# Patient Record
Sex: Female | Born: 1995 | Race: White | Hispanic: No | Smoking: Never smoker
Health system: Southern US, Community
[De-identification: ages and names within clinical notes are randomized; demographics above are authoritative.]

## PROBLEM LIST (undated history)

## (undated) DIAGNOSIS — Z5189 Encounter for other specified aftercare: Secondary | ICD-10-CM

## (undated) DIAGNOSIS — D649 Anemia, unspecified: Secondary | ICD-10-CM

## (undated) HISTORY — DX: Anemia, unspecified: D64.9

## (undated) HISTORY — DX: Encounter for other specified aftercare: Z51.89

---
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
## 2014-03-08 ENCOUNTER — Emergency Department: Payer: Self-pay | Admitting: Emergency Medicine

## 2014-09-17 ENCOUNTER — Emergency Department: Payer: Self-pay | Admitting: Emergency Medicine

## 2014-09-17 LAB — CBC WITH DIFFERENTIAL/PLATELET
BASOS ABS: 0.1 10*3/uL (ref 0.0–0.1)
BASOS PCT: 0.8 %
EOS ABS: 0.2 10*3/uL (ref 0.0–0.7)
EOS PCT: 2.4 %
HCT: 35.5 % (ref 35.0–47.0)
HGB: 12 g/dL (ref 12.0–16.0)
LYMPHS PCT: 40.7 %
Lymphocyte #: 3.4 10*3/uL (ref 1.0–3.6)
MCH: 27.7 pg (ref 26.0–34.0)
MCHC: 33.9 g/dL (ref 32.0–36.0)
MCV: 82 fL (ref 80–100)
MONO ABS: 0.6 x10 3/mm (ref 0.2–0.9)
Monocyte %: 7.3 %
Neutrophil #: 4.1 10*3/uL (ref 1.4–6.5)
Neutrophil %: 48.8 %
PLATELETS: 406 10*3/uL (ref 150–440)
RBC: 4.33 10*6/uL (ref 3.80–5.20)
RDW: 12.7 % (ref 11.5–14.5)
WBC: 8.3 10*3/uL (ref 3.6–11.0)

## 2014-09-17 LAB — COMPREHENSIVE METABOLIC PANEL
ALK PHOS: 85 U/L (ref 46–116)
AST: 25 U/L (ref 15–37)
Albumin: 4.1 g/dL (ref 3.4–5.0)
Anion Gap: 8 (ref 7–16)
BUN: 9 mg/dL (ref 7–18)
Bilirubin,Total: 0.2 mg/dL (ref 0.2–1.0)
CHLORIDE: 102 mmol/L (ref 98–107)
Calcium, Total: 9.7 mg/dL (ref 8.5–10.1)
Co2: 28 mmol/L (ref 21–32)
Creatinine: 0.71 mg/dL (ref 0.60–1.30)
EGFR (African American): >60
EGFR (Non-African Amer.): >60
GLUCOSE: 100 mg/dL — AB (ref 65–99)
Osmolality: 274 (ref 275–301)
Potassium: 3.7 mmol/L (ref 3.5–5.1)
SGPT (ALT): 42 U/L (ref 14–63)
SODIUM: 138 mmol/L (ref 136–145)
Total Protein: 8.3 g/dL — ABNORMAL HIGH (ref 6.4–8.2)

## 2014-09-17 LAB — URINALYSIS, COMPLETE
BACTERIA: NONE SEEN
BILIRUBIN, UR: NEGATIVE
Glucose,UR: NEGATIVE mg/dL (ref 0–75)
KETONE: NEGATIVE
LEUKOCYTE ESTERASE: NEGATIVE
NITRITE: NEGATIVE
Ph: 6 (ref 4.5–8.0)
Protein: NEGATIVE
Specific Gravity: 1.023 (ref 1.003–1.030)
Squamous Epithelial: 1
WBC UR: 1 /HPF (ref 0–5)

## 2014-09-17 LAB — PREGNANCY, URINE: Pregnancy Test, Urine: NEGATIVE m[IU]/mL

## 2015-09-29 DIAGNOSIS — E669 Obesity, unspecified: Secondary | ICD-10-CM | POA: Insufficient documentation

## 2015-09-29 LAB — HM PAP SMEAR: HM Pap smear: NEGATIVE

## 2016-02-21 ENCOUNTER — Emergency Department
Admission: EM | Admit: 2016-02-21 | Discharge: 2016-02-21 | Disposition: A | Discharge status: Home / Self Care | Payer: Self-pay | Attending: Emergency Medicine | Admitting: Emergency Medicine

## 2016-02-21 ENCOUNTER — Emergency Department: Payer: Self-pay

## 2016-02-21 ENCOUNTER — Encounter: Payer: Self-pay | Admitting: *Deleted

## 2016-02-21 DIAGNOSIS — M79662 Pain in left lower leg: Secondary | ICD-10-CM

## 2016-02-21 DIAGNOSIS — M79605 Pain in left leg: Secondary | ICD-10-CM | POA: Insufficient documentation

## 2016-02-21 LAB — CBC WITH DIFFERENTIAL/PLATELET
Basophils Absolute: 0.1 10*3/uL (ref 0–0.1)
Basophils Relative: 1 %
EOS PCT: 5 %
Eosinophils Absolute: 0.6 10*3/uL (ref 0–0.7)
HCT: 40.4 % (ref 35.0–47.0)
Hemoglobin: 13.5 g/dL (ref 12.0–16.0)
LYMPHS ABS: 3 10*3/uL (ref 1.0–3.6)
LYMPHS PCT: 30 %
MCH: 26.9 pg (ref 26.0–34.0)
MCHC: 33.4 g/dL (ref 32.0–36.0)
MCV: 80.6 fL (ref 80.0–100.0)
MONO ABS: 0.7 10*3/uL (ref 0.2–0.9)
Monocytes Relative: 7 %
Neutro Abs: 5.8 10*3/uL (ref 1.4–6.5)
Neutrophils Relative %: 57 %
PLATELETS: 378 10*3/uL (ref 150–440)
RBC: 5.01 MIL/uL (ref 3.80–5.20)
RDW: 13.7 % (ref 11.5–14.5)
WBC: 10.2 10*3/uL (ref 3.6–11.0)

## 2016-02-21 LAB — COMPREHENSIVE METABOLIC PANEL
ALT: 28 U/L (ref 14–54)
AST: 22 U/L (ref 15–41)
Albumin: 4.2 g/dL (ref 3.5–5.0)
Alkaline Phosphatase: 55 U/L (ref 38–126)
Anion gap: 8 (ref 5–15)
BUN: 10 mg/dL (ref 6–20)
CALCIUM: 9.4 mg/dL (ref 8.9–10.3)
CO2: 26 mmol/L (ref 22–32)
Chloride: 104 mmol/L (ref 101–111)
Creatinine, Ser: 0.77 mg/dL (ref 0.44–1.00)
GFR calc non Af Amer: >60 mL/min (ref >60)
Glucose, Bld: 88 mg/dL (ref 65–99)
Potassium: 3.7 mmol/L (ref 3.5–5.1)
Sodium: 138 mmol/L (ref 135–145)
Total Bilirubin: 0.8 mg/dL (ref 0.3–1.2)
Total Protein: 8.1 g/dL (ref 6.5–8.1)

## 2016-02-21 MED ORDER — NAPROXEN 500 MG PO TABS: 500.0000 mg | ORAL_TABLET | Freq: Two times a day (BID) | ORAL | Status: DC

## 2016-02-21 NOTE — ED Notes (Signed)
Pt verbalized understanding of discharge instructions. NAD at this time. 

## 2016-02-21 NOTE — ED Provider Notes (Signed)
[facility] Emergency Department Provider Note   ____________________________________________  Time seen: Approximately 3:36 PM  I have reviewed the triage vital signs and the nursing notes.   HISTORY  Chief Complaint Foot Pain   HPI Melissa Mercer is a 20 y.o. female is here with complaint of left foot and lower leg pain off and on for approximately 3 months. Patient denies any history of injury. Patient has not currently seen anyone for this. She denies any fever or chills. She continues to walk without a difficulty with these episodes. She denies any redness or heat when she touches her leg. She denies any paresthesias to her lower extremity, denies fever or chills, denies any rash. Currently she rates her pain as 6/10.   History reviewed. No pertinent past medical history.  There are no active problems to display for this patient.   History reviewed. No pertinent past surgical history.  Current Outpatient Rx  Name  Route  Sig  Dispense  Refill  . naproxen (NAPROSYN) 500 MG tablet   Oral   Take 1 tablet (500 mg total) by mouth 2 (two) times daily with a meal.   30 tablet   0     Allergies Review of patient's allergies indicates no known allergies.  No family history on file.  Social History Social History  Substance Use Topics  . Smoking status: Never Smoker   . Smokeless tobacco: None  . Alcohol Use: No    Review of Systems Constitutional: No fever/chills Cardiovascular: Denies chest pain. Respiratory: Denies shortness of breath. Gastrointestinal:   No nausea, no vomiting.  Musculoskeletal: Positive for left leg and foot pain. Skin: Negative for rash. Neurological: Negative for headaches, focal weakness or numbness.  10-point ROS otherwise negative.  ____________________________________________   PHYSICAL EXAM:  VITAL SIGNS: ED Triage Vitals  Enc Vitals Group     BP 02/21/16 1448 127/78 mmHg     Pulse Rate 02/21/16  1448 106     Resp 02/21/16 1448 18     Temp 02/21/16 1448 98.1 F (36.7 C)     Temp Source 02/21/16 1448 Oral     SpO2 02/21/16 1448 96 %     Weight 02/21/16 1448 190 lb (86.183 kg)     Height 02/21/16 1448 5\' 4"  (1.626 m)     Head Cir --      Peak Flow --      Pain Score 02/21/16 1443 6     Pain Loc --      Pain Edu? --      Excl. in GC? --     Constitutional: Alert and oriented. Well appearing and in no acute distress. Eyes: Conjunctivae are normal. PERRL. EOMI. Head: Atraumatic. Nose: No congestion/rhinnorhea. Neck: No stridor.   Cardiovascular: Normal rate, regular rhythm. Grossly normal heart sounds.  Good peripheral circulation. Respiratory: Normal respiratory effort.  No retractions. Lungs CTAB. Musculoskeletal: On examination of the left lower extremity there is tenderness on palpation of the mid tib-fib and then distally without any gross deformity noted. There is tenderness on palpation of the left foot without any gross deformity noted. There is no erythema or warmth noted. There is no skin abrasions, ecchymosis or erythema present. Motor sensory function intact. Patient is able to bear weight with a difficulty or assistance. Neurologic:  Normal speech and language. No gross focal neurologic deficits are appreciated. No gait instability. Skin:  Skin is warm, dry and intact. No rash noted. Mild nonpitting edema is  present dorsum of the left foot. Psychiatric: Mood and affect are normal. Speech and behavior are normal.  ____________________________________________   LABS (all labs ordered are listed, but only abnormal results are displayed)  Labs Reviewed  COMPREHENSIVE METABOLIC PANEL  CBC WITH DIFFERENTIAL/PLATELET  CBC WITH DIFFERENTIAL/PLATELET    RADIOLOGY  X-ray left tib-fib and foot per radiologist is negative. I, Tommi Rumpshonda L Summers, personally viewed and evaluated these images (plain radiographs) as part of my medical decision making, as well as reviewing the  written report by the radiologist. ____________________________________________   PROCEDURES  Procedure(s) performed: None  Critical Care performed: No  ____________________________________________   INITIAL IMPRESSION / ASSESSMENT AND PLAN / ED COURSE  Pertinent labs & imaging results that were available during my care of the patient were reviewed by me and considered in my medical decision making (see chart for details).  Patient was reassured that there was no findings on her x-ray to explain her pain. Lab work was within normal limits. Patient was started on naproxen 500 mg twice a day with food and to [facility] if any continued problems. ____________________________________________   FINAL CLINICAL IMPRESSION(S) / ED DIAGNOSES  Final diagnoses:  Pain in left lower leg      NEW MEDICATIONS STARTED DURING THIS VISIT:  Discharge Medication List as of 02/21/2016  5:06 PM    START taking these medications   Details  naproxen (NAPROSYN) 500 MG tablet Take 1 tablet (500 mg total) by mouth 2 (two) times daily with a meal., Starting 02/21/2016, Until Discontinued, Print         Note:  This document was prepared using Dragon voice recognition software and may include unintentional dictation errors.    Tommi RumpsRhonda L Summers, PA-C 02/21/16 1752  Sharman CheekPhillip Stafford, MD 02/22/16 [phone]

## 2016-02-21 NOTE — Discharge Instructions (Signed)
Begin taking Naprosyn twice a day with food. Avoid salt and elevate leg frequently to decrease swelling. [facility] any [facility].

## 2016-02-21 NOTE — ED Notes (Signed)
Pt reports left foot pain and swelling on and off for 3 months

## 2016-06-01 ENCOUNTER — Encounter: Payer: Self-pay | Admitting: Emergency Medicine

## 2016-06-01 ENCOUNTER — Emergency Department
Admission: EM | Admit: 2016-06-01 | Discharge: 2016-06-01 | Disposition: A | Discharge status: Home / Self Care | Payer: Medicaid Other | Attending: Emergency Medicine | Admitting: Emergency Medicine

## 2016-06-01 DIAGNOSIS — O2311 Infections of bladder in pregnancy, first trimester: Secondary | ICD-10-CM | POA: Diagnosis not present

## 2016-06-01 DIAGNOSIS — Z3A00 Weeks of gestation of pregnancy not specified: Secondary | ICD-10-CM | POA: Diagnosis not present

## 2016-06-01 DIAGNOSIS — Z331 Pregnant state, incidental: Secondary | ICD-10-CM

## 2016-06-01 DIAGNOSIS — N3001 Acute cystitis with hematuria: Secondary | ICD-10-CM

## 2016-06-01 LAB — URINALYSIS COMPLETE WITH MICROSCOPIC (ARMC ONLY)
BILIRUBIN URINE: NEGATIVE
GLUCOSE, UA: NEGATIVE mg/dL
Ketones, ur: NEGATIVE mg/dL
NITRITE: NEGATIVE
Protein, ur: 100 mg/dL — AB
SPECIFIC GRAVITY, URINE: 1.012 (ref 1.005–1.030)
pH: 7 (ref 5.0–8.0)

## 2016-06-01 LAB — POCT PREGNANCY, URINE: Preg Test, Ur: POSITIVE — AB

## 2016-06-01 MED ORDER — CEPHALEXIN 500 MG PO CAPS: 500.0000 mg | ORAL_CAPSULE | Freq: Four times a day (QID) | ORAL | 0 refills | Status: DC

## 2016-06-01 NOTE — Discharge Instructions (Signed)
Increase fluids. Take antibiotic until completely finished in 10 days. Call and make an appointment at the health department for prenatal care and also to recheck a urine.

## 2016-06-01 NOTE — ED Notes (Signed)
See triage note   States she is having dysuria and freq for about 1 week  Now states pain is getting worse and also having some left flank pain

## 2016-06-01 NOTE — ED Provider Notes (Signed)
[facility] Emergency Department Provider Note  ____________________________________________   First MD Initiated Contact with Patient 06/01/16 [phone]     (approximate)  I have reviewed the triage vital signs and the nursing notes.   HISTORY  Chief Complaint Urinary Frequency   HPI Melissa Mercer is a 20 y.o. female . Complaint of dysuria and frequency for about one week. Patient states that currently she is urinating approximately every 10-15 minutes or at least feels the urge. She is also seen some blood in her urine occasionally. She denies any fever or chills. There is no nausea or vomiting. She states she has a history of urinary tract infections. Patient also gives a history of no menses for approximately 3 months. Patient states that she is irregular and was not concerned. Patient discontinued birth control approximately 3-4 months ago.She rates her discomfort as a 10 over 10.   History reviewed. No pertinent past medical history.  There are no active problems to display for this patient.   History reviewed. No pertinent surgical history.  Prior to Admission medications   Medication Sig Start Date End Date Taking? Authorizing Provider  cephALEXin (KEFLEX) 500 MG capsule Take 1 capsule (500 mg total) by mouth 4 (four) times daily. 06/01/16   Tommi Rumps, PA-C  naproxen (NAPROSYN) 500 MG tablet Take 1 tablet (500 mg total) by mouth 2 (two) times daily with a meal. 02/21/16   Tommi Rumps, PA-C    Allergies Review of patient's allergies indicates no known allergies.  History reviewed. No pertinent family history.  Social History Social History  Substance Use Topics  . Smoking status: Never Smoker  . Smokeless tobacco: Never Used  . Alcohol use No    Review of Systems Constitutional: No fever/Negative for chills Eyes: No visual changes. ENT: No sore throat. Cardiovascular: Denies chest pain. Respiratory: Denies shortness of  breath. Gastrointestinal: No abdominal pain.  No nausea, no vomiting.  Genitourinary: Positive for dysuria. Musculoskeletal: Reported left flank pain per person. Skin: Negative for rash. Neurological: Negative for headaches, focal weakness or numbness.  10-point ROS otherwise negative.  ____________________________________________   PHYSICAL EXAM:  VITAL SIGNS: ED Triage Vitals [06/01/16 0846]  Enc Vitals Group     BP      Pulse      Resp      Temp      Temp src      SpO2      Weight 190 lb (86.2 kg)     Height 5\' 4"  (1.626 m)     Head Circumference      Peak Flow      Pain Score 10     Pain Loc      Pain Edu?      Excl. in GC?     Constitutional: Alert and oriented. Well appearing and in no acute distress. Eyes: Conjunctivae are normal. PERRL. EOMI. Head: Atraumatic. Nose: No congestion/rhinnorhea. Mouth/Throat: Mucous membranes are moist.  Oropharynx non-erythematous. Neck: No stridor.   Cardiovascular: Normal rate, regular rhythm. Grossly normal heart sounds.  Good peripheral circulation. Respiratory: Normal respiratory effort.  No retractions. Lungs CTAB. Gastrointestinal: Soft and nontender. No distention. Bowel sounds normoactive 4 quadrants. No CVA tenderness. Musculoskeletal: Moves upper and lower extremities without any difficulty. Normal gait was noted. On examination of the back there is no deformity or difficulty with range of motion. No CVA tenderness was noted and when patient was asked to point to her area of pain she points  to the upper sacral area and paravertebral muscles on the left. Normal gait was noted. Neurologic:  Normal speech and language. No gross focal neurologic deficits are appreciated. No gait instability. Skin:  Skin is warm, dry and intact. No rash noted. Psychiatric: Mood and affect are normal. Speech and behavior are normal.  ____________________________________________   LABS (all labs ordered are listed, but only abnormal results  are displayed)  Labs Reviewed  URINALYSIS COMPLETEWITH MICROSCOPIC ([facility] ONLY) - Abnormal; Notable for the following:       Result Value   Color, Urine YELLOW (*)    APPearance CLOUDY (*)    Hgb urine dipstick 3+ (*)    Protein, ur 100 (*)    Leukocytes, UA 3+ (*)    Bacteria, UA RARE (*)    Squamous Epithelial / LPF 6-30 (*)    All other components within normal limits  POCT PREGNANCY, URINE - Abnormal; Notable for the following:    Preg Test, Ur POSITIVE (*)    All other components within normal limits  URINE CULTURE  POC URINE PREG, ED     PROCEDURES  Procedure(s) performed: None  Procedures  Critical Care performed: No  ____________________________________________   INITIAL IMPRESSION / ASSESSMENT AND PLAN / ED COURSE  Pertinent labs & imaging results that were available during my care of the patient were reviewed by me and considered in my medical decision making (see chart for details).    Clinical Course   Patient was given information about her urine pregnancy test being positive. Patient denies any vaginal discharge or pelvic pain. Patient is only experiencing cystitis symptoms of burning and frequency. Patient was given a prescription for Keflex 500 mg 4 times a day for 10 days. Culture and sensitivity was ordered. Patient is follow-up with the health department for prenatal care and follow-up of her urinary tract infection.  ____________________________________________   FINAL CLINICAL IMPRESSION(S) / ED DIAGNOSES  Final diagnoses:  Acute cystitis with hematuria  Pregnancy as incidental finding      NEW MEDICATIONS STARTED DURING THIS VISIT:  Discharge Medication List as of 06/01/2016  9:46 AM    START taking these medications   Details  cephALEXin (KEFLEX) 500 MG capsule Take 1 capsule (500 mg total) by mouth 4 (four) times daily., Starting Wed 06/01/2016, Print         Note:  This document was prepared using Dragon voice recognition  software and may include unintentional dictation errors.    Tommi Rumpshonda L Summers, PA-C 06/01/16 1239    Governor Rooksebecca Lord, MD 06/01/16 1257

## 2016-06-01 NOTE — ED Triage Notes (Signed)
Pt to ed with c/o burning and frequency with urination x 4 days.  Pt states hx of UTI.

## 2016-06-03 LAB — URINE CULTURE: SPECIAL REQUESTS: NORMAL

## 2016-06-04 NOTE — Progress Notes (Addendum)
20 y/o pregnant female d/c from ED 06/01/16 on cephalexin for UTI. Urine cx grew CNS resistant to oxacillin. Dr. Dorothea GlassmanPaul Malinda authorized a prescription for Macrobid 100 mg bid x 7 days and d/c cephalexin. Attempted to call patient at [phone] but phone not accepting calls.   Melissa Mercer, PharmD Clinical Pharmacist   06/05/16 @ 1350 attempted to call patient again on only number listed in chart but number cannot accept calls.

## 2016-06-08 ENCOUNTER — Emergency Department: Payer: Medicaid Other

## 2016-06-08 ENCOUNTER — Encounter: Payer: Self-pay | Admitting: Emergency Medicine

## 2016-06-08 ENCOUNTER — Emergency Department
Admission: EM | Admit: 2016-06-08 | Discharge: 2016-06-08 | Disposition: A | Discharge status: Home / Self Care | Payer: Medicaid Other | Attending: Student in an Organized Health Care Education/Training Program | Admitting: Student in an Organized Health Care Education/Training Program

## 2016-06-08 DIAGNOSIS — O4690 Antepartum hemorrhage, unspecified, unspecified trimester: Secondary | ICD-10-CM

## 2016-06-08 DIAGNOSIS — O418X10 Other specified disorders of amniotic fluid and membranes, first trimester, not applicable or unspecified: Secondary | ICD-10-CM

## 2016-06-08 DIAGNOSIS — O468X1 Other antepartum hemorrhage, first trimester: Secondary | ICD-10-CM

## 2016-06-08 DIAGNOSIS — Z792 Long term (current) use of antibiotics: Secondary | ICD-10-CM | POA: Insufficient documentation

## 2016-06-08 DIAGNOSIS — Z791 Long term (current) use of non-steroidal anti-inflammatories (NSAID): Secondary | ICD-10-CM | POA: Diagnosis not present

## 2016-06-08 DIAGNOSIS — Z3A01 Less than 8 weeks gestation of pregnancy: Secondary | ICD-10-CM | POA: Diagnosis not present

## 2016-06-08 DIAGNOSIS — O208 Other hemorrhage in early pregnancy: Secondary | ICD-10-CM | POA: Diagnosis not present

## 2016-06-08 DIAGNOSIS — O209 Hemorrhage in early pregnancy, unspecified: Secondary | ICD-10-CM | POA: Diagnosis present

## 2016-06-08 LAB — HCG, QUANTITATIVE, PREGNANCY: HCG, BETA CHAIN, QUANT, S: 31743 m[IU]/mL — AB (ref <5)

## 2016-06-08 LAB — POCT PREGNANCY, URINE: Preg Test, Ur: POSITIVE — AB

## 2016-06-08 LAB — ABO/RH: ABO/RH(D): B POS

## 2016-06-08 NOTE — ED Provider Notes (Signed)
[name] Emergency Department Provider Note    First MD Initiated Contact with Patient 06/08/16 [phone]     (approximate)  I have reviewed the triage vital signs and the nursing notes.   HISTORY  Chief Complaint Vaginal Bleeding    HPI Melissa Mercer is a 20 y.o. female who presents with 1 week of intermittent vaginal spotting 3 since her last menstrual period. Patient is a G2 P1. States that she was recently diagnosed with a UTI found out at that time that she is pregnant. Denies any abdominal pain. States she's not had OB follow-up yet. Denies any history of easy bleeding or bruising. No nausea or vomiting. No dysuria. No fevers or chest pain.  States that she did have one small brown clot today followed by vaginal spotting. No recent trauma.   History reviewed. No pertinent past medical history.  There are no active problems to display for this patient.   History reviewed. No pertinent surgical history.  Prior to Admission medications   Medication Sig Start Date End Date Taking? Authorizing Provider  cephALEXin (KEFLEX) 500 MG capsule Take 1 capsule (500 mg total) by mouth 4 (four) times daily. 06/01/16   Tommi Rumps, PA-C  naproxen (NAPROSYN) 500 MG tablet Take 1 tablet (500 mg total) by mouth 2 (two) times daily with a meal. 02/21/16   Tommi Rumps, PA-C    Allergies Review of patient's allergies indicates no known allergies.  No family history on file.  Social History Social History  Substance Use Topics  . Smoking status: Never Smoker  . Smokeless tobacco: Never Used  . Alcohol use No    Review of Systems Patient denies headaches, rhinorrhea, blurry vision, numbness, shortness of breath, chest pain, edema, cough, abdominal pain, nausea, vomiting, diarrhea, dysuria, fevers, rashes or hallucinations unless otherwise stated above in HPI. ____________________________________________   PHYSICAL EXAM:  VITAL SIGNS: Vitals:   06/08/16 0739  BP: 115/69  Pulse: 89  Resp: 16  Temp: 98.3 F (36.8 C)    Constitutional: Alert and oriented. Well appearing and in no acute distress. Eyes: Conjunctivae are normal. PERRL. EOMI. Head: Atraumatic. Nose: No congestion/rhinnorhea. Mouth/Throat: Mucous membranes are moist.  Oropharynx non-erythematous. Neck: No stridor. Painless ROM. No cervical spine tenderness to palpation Hematological/Lymphatic/Immunilogical: No cervical lymphadenopathy. Cardiovascular: Normal rate, regular rhythm. Grossly normal heart sounds.  Good peripheral circulation. Respiratory: Normal respiratory effort.  No retractions. Lungs CTAB. Gastrointestinal: Soft and nontender. No distention. No abdominal bruits. No CVA tenderness. Genitourinary: closed cervical os, no active hemorrhage Musculoskeletal: No lower extremity tenderness nor edema.  No joint effusions. Neurologic:  Normal speech and language. No gross focal neurologic deficits are appreciated. No gait instability. Skin:  Skin is warm, dry and intact. No rash noted. Psychiatric: Mood and affect are normal. Speech and behavior are normal.  ____________________________________________   LABS (all labs ordered are listed, but only abnormal results are displayed)  Results for orders placed or performed during the hospital encounter of 06/08/16 (from the past 24 hour(s))  ABO/Rh     Status: None   Collection Time: 06/08/16  8:04 AM  Result Value Ref Range   ABO/RH(D) B POS   hCG, quantitative, pregnancy     Status: Abnormal   Collection Time: 06/08/16  8:05 AM  Result Value Ref Range   hCG, Beta Chain, Quant, S 31,743 (H) <5 mIU/mL  Pregnancy, urine POC     Status: Abnormal   Collection Time: 06/08/16  8:23 AM  Result Value Ref Range   Preg Test, Ur POSITIVE (A) NEGATIVE   ____________________________________________ ____________________________________________  RADIOLOGY  I personally reviewed all radiographic images ordered to  evaluate for the above acute complaints and reviewed radiology reports and findings.  These findings were personally discussed with the patient.  Please see medical record for radiology report.  ____________________________________________   PROCEDURES  Procedure(s) performed: none    Critical Care performed: no ____________________________________________   INITIAL IMPRESSION / ASSESSMENT AND PLAN / ED COURSE  Pertinent labs & imaging results that were available during my care of the patient were reviewed by me and considered in my medical decision making (see chart for details).  DDX: ectopic, missed ab, threatened ab, placenta previa  Melissa Mercer is a 20 y.o. who presents to the ED with vaginal spotting in early pregnancy. Patient is afebrile and hemodynamically stable. Her abdominal exam is reassuring. Bedside ultrasound unable to identify IUP but likely secondary to early gestation. Will check Rh status as well as formal ultrasound to evaluate for intrauterine pregnancy.  The patient will be placed on continuous pulse oximetry and telemetry for monitoring.  Laboratory evaluation will be sent to evaluate for the above complaints.     Clinical Course  Comment By Time  Rh Pos.  Willy EddyPatrick Ndrew Creason, MD 10/18 [phone]  Discussed results of ultrasound with patient. Discussed elective management. Willy EddyPatrick Onie Hayashi, MD 10/18 1004    Patient tolerated pelvic without discomfort.  Os is normal appearing.  Patient stable for outpatient follow up. Have discussed with the patient and available family all diagnostics and treatments performed thus far and all questions were answered to the best of my ability. The patient demonstrates understanding and agreement with plan.   ____________________________________________   FINAL CLINICAL IMPRESSION(S) / ED DIAGNOSES  Final diagnoses:  Vaginal bleeding in pregnancy  Subchorionic hematoma in first trimester, single or unspecified fetus       NEW MEDICATIONS STARTED DURING THIS VISIT:  New Prescriptions   No medications on file     Note:  This document was prepared using Dragon voice recognition software and may include unintentional dictation errors.    Willy EddyPatrick Jeanpaul Biehl, MD 06/08/16 [phone]

## 2016-06-08 NOTE — ED Triage Notes (Signed)
Reports no period for 3 months, thinks she is pregnant.  Today started having bleeding. No cramping.

## 2016-06-08 NOTE — ED Notes (Signed)
Pt ambulatory to lobby. NAD noted. 

## 2016-06-08 NOTE — ED Notes (Signed)
Patient transported to Ultrasound 

## 2016-06-20 LAB — HM HIV SCREENING LAB: HM HIV Screening: NEGATIVE

## 2018-02-09 ENCOUNTER — Emergency Department
Admission: EM | Admit: 2018-02-09 | Discharge: 2018-02-09 | Disposition: A | Discharge status: Home / Self Care | Payer: Self-pay | Attending: Emergency Medicine | Admitting: Emergency Medicine

## 2018-02-09 ENCOUNTER — Encounter: Payer: Self-pay | Admitting: Emergency Medicine

## 2018-02-09 ENCOUNTER — Other Ambulatory Visit: Payer: Self-pay

## 2018-02-09 DIAGNOSIS — N939 Abnormal uterine and vaginal bleeding, unspecified: Secondary | ICD-10-CM | POA: Insufficient documentation

## 2018-02-09 LAB — BASIC METABOLIC PANEL
Anion gap: 9 (ref 5–15)
BUN: 10 mg/dL (ref 6–20)
CHLORIDE: 103 mmol/L (ref 101–111)
CO2: 25 mmol/L (ref 22–32)
Calcium: 9.2 mg/dL (ref 8.9–10.3)
Creatinine, Ser: 0.58 mg/dL (ref 0.44–1.00)
GFR calc Af Amer: >60 mL/min (ref >60)
GFR calc non Af Amer: >60 mL/min (ref >60)
GLUCOSE: 96 mg/dL (ref 65–99)
POTASSIUM: 3.9 mmol/L (ref 3.5–5.1)
SODIUM: 137 mmol/L (ref 135–145)

## 2018-02-09 LAB — URINALYSIS, COMPLETE (UACMP) WITH MICROSCOPIC
BILIRUBIN URINE: NEGATIVE
Glucose, UA: NEGATIVE mg/dL
Ketones, ur: NEGATIVE mg/dL
LEUKOCYTES UA: NEGATIVE
Nitrite: NEGATIVE
PROTEIN: 30 mg/dL — AB
Specific Gravity, Urine: 1.014 (ref 1.005–1.030)
pH: 6 (ref 5.0–8.0)

## 2018-02-09 LAB — CBC
HEMATOCRIT: 35.2 % (ref 35.0–47.0)
Hemoglobin: 12.1 g/dL (ref 12.0–16.0)
MCH: 28.8 pg (ref 26.0–34.0)
MCHC: 34.4 g/dL (ref 32.0–36.0)
MCV: 83.9 fL (ref 80.0–100.0)
Platelets: 441 10*3/uL — ABNORMAL HIGH (ref 150–440)
RBC: 4.19 MIL/uL (ref 3.80–5.20)
RDW: 12.3 % (ref 11.5–14.5)
WBC: 11.3 10*3/uL — AB (ref 3.6–11.0)

## 2018-02-09 LAB — POCT PREGNANCY, URINE: PREG TEST UR: NEGATIVE

## 2018-02-09 NOTE — ED Triage Notes (Signed)
Patient presents to the ED with vaginal bleeding x 4 months.  Patient states she has been feeling weak.  Patient reports having to get a blood transfusion in the past due to this problem.

## 2018-02-09 NOTE — ED Notes (Signed)
Pt alert and oriented X4, active, cooperative, pt in NAD. RR even and unlabored, color WNL.  Pt informed to return if any life threatening symptoms occur.  Discharge and followup instructions reviewed.  

## 2018-02-09 NOTE — ED Provider Notes (Signed)
[facility] Emergency Department Provider Note   ____________________________________________    I have reviewed the triage vital signs and the nursing notes.   HISTORY  Chief Complaint Vaginal Bleeding     HPI Melissa Mercer is a 22 y.o. female who presents with vaginal bleeding.  Patient reports she has been bleeding for over 1 month, occasionally she has large clots.  She was started on Ortho Tri-Cyclen by her doctor 1 month ago but it has not seemed to help her bleeding.  She denies abdominal pain.  She reports when she was a teenager she had a blood transfusion for vaginal bleeding.  No pelvic pain.  Felt dizzy last night but feels well now.  History reviewed. No pertinent past medical history.  There are no active problems to display for this patient.   History reviewed. No pertinent surgical history.  Prior to Admission medications   Medication Sig Start Date End Date Taking? Authorizing Provider  cephALEXin (KEFLEX) 500 MG capsule Take 1 capsule (500 mg total) by mouth 4 (four) times daily. 06/01/16   Tommi Rumps, PA-C  naproxen (NAPROSYN) 500 MG tablet Take 1 tablet (500 mg total) by mouth 2 (two) times daily with a meal. 02/21/16   Tommi Rumps, PA-C     Allergies Patient has no known allergies.  No family history on file.  Social History Social History   Tobacco Use  . Smoking status: Never Smoker  . Smokeless tobacco: Never Used  Substance Use Topics  . Alcohol use: No  . Drug use: Not on file    Review of Systems  Constitutional: As above Eyes: No visual changes.  ENT: No neck pain Cardiovascular: No palpitation Respiratory: Denies shortness of breath. Gastrointestinal: No abdominal pain.  No nausea, no vomiting.   Genitourinary: As above Musculoskeletal: Negative for back pain. Skin: Negative for rash. Neurological: Negative for headaches   ____________________________________________   PHYSICAL  EXAM:  VITAL SIGNS: ED Triage Vitals [02/09/18 1029]  Enc Vitals Group     BP 138/90     Pulse Rate 80     Resp 16     Temp 98.9 F (37.2 C)     Temp Source Oral     SpO2 100 %     Weight 93 kg (205 lb)     Height 1.651 m (5\' 5" )     Head Circumference      Peak Flow      Pain Score 0     Pain Loc      Pain Edu?      Excl. in GC?     Constitutional: Alert and oriented. No acute distress. Pleasant and interactive Eyes: Conjunctivae are normal.  No pallor   Mouth/Throat: Mucous membranes are moist.    Cardiovascular: Normal rate, regular rhythm. Grossly normal heart sounds.  Good peripheral circulation. Respiratory: Normal respiratory effort.  No retractions. . Gastrointestinal: Soft and nontender. No distention.    Musculoskeletal:   Warm and well perfused Neurologic:  Normal speech and language. No gross focal neurologic deficits are appreciated.  Skin:  Skin is warm, dry and intact. No rash noted. Psychiatric: Mood and affect are normal. Speech and behavior are normal.  ____________________________________________   LABS (all labs ordered are listed, but only abnormal results are displayed)  Labs Reviewed  CBC - Abnormal; Notable for the following components:      Result Value   WBC 11.3 (*)    Platelets 441 (*)  All other components within normal limits  URINALYSIS, COMPLETE (UACMP) WITH MICROSCOPIC - Abnormal; Notable for the following components:   Color, Urine YELLOW (*)    APPearance CLEAR (*)    Hgb urine dipstick LARGE (*)    Protein, ur 30 (*)    Bacteria, UA RARE (*)    All other components within normal limits  BASIC METABOLIC PANEL  POC URINE PREG, ED  POCT PREGNANCY, URINE   ____________________________________________  EKG  ED ECG REPORT I, Jene Everyobert Quaran Kedzierski, the attending physician, personally viewed and interpreted this ECG.  Date: 02/09/2018  Rhythm: normal sinus rhythm QRS Axis: normal Intervals: normal ST/T Wave abnormalities:  normal Narrative Interpretation: no evidence of acute ischemia  ____________________________________________  RADIOLOGY  None ____________________________________________   PROCEDURES  Procedure(s) performed: No  Procedures   Critical Care performed: No ____________________________________________   INITIAL IMPRESSION / ASSESSMENT AND PLAN / ED COURSE  Pertinent labs & imaging results that were available during my care of the patient were reviewed by me and considered in my medical decision making (see chart for details).  Patient well-appearing in no acute distress.  Exam is reassuring.  Vitals are unremarkable.  No tachycardia.  No abdominal tenderness.  Vaginal bleeding for over 4 weeks now.  Negative pregnancy test.  We will have the patient follow-up with gynecology given stable hemoglobin   ____________________________________________   FINAL CLINICAL IMPRESSION(S) / ED DIAGNOSES  Final diagnoses:  Vaginal bleeding        Note:  This document was prepared using Dragon voice recognition software and may include unintentional dictation errors.    Jene EveryKinner, Uriyah Massimo, MD 02/09/18 [phone]

## 2019-03-05 ENCOUNTER — Encounter: Payer: Self-pay | Admitting: Emergency Medicine

## 2019-03-05 ENCOUNTER — Emergency Department
Admission: EM | Admit: 2019-03-05 | Discharge: 2019-03-05 | Disposition: A | Discharge status: Home / Self Care | Payer: Self-pay | Attending: Emergency Medicine | Admitting: Emergency Medicine

## 2019-03-05 ENCOUNTER — Other Ambulatory Visit: Payer: Self-pay

## 2019-03-05 DIAGNOSIS — N3001 Acute cystitis with hematuria: Secondary | ICD-10-CM | POA: Insufficient documentation

## 2019-03-05 LAB — URINALYSIS, COMPLETE (UACMP) WITH MICROSCOPIC
Glucose, UA: NEGATIVE mg/dL
Ketones, ur: NEGATIVE mg/dL
Nitrite: POSITIVE — AB
Protein, ur: 100 mg/dL — AB
RBC / HPF: >50 RBC/hpf — ABNORMAL HIGH (ref 0–5)
Specific Gravity, Urine: 1.034 — ABNORMAL HIGH (ref 1.005–1.030)
WBC, UA: >50 WBC/hpf — ABNORMAL HIGH (ref 0–5)
pH: 5 (ref 5.0–8.0)

## 2019-03-05 LAB — POCT PREGNANCY, URINE: Preg Test, Ur: NEGATIVE

## 2019-03-05 MED ORDER — PHENAZOPYRIDINE HCL 100 MG PO TABS
95.0000 mg | ORAL_TABLET | Freq: Once | ORAL | Status: DC
  Filled 2019-03-05: qty 1

## 2019-03-05 MED ORDER — CEPHALEXIN 500 MG PO CAPS: 500.0000 mg | ORAL_CAPSULE | Freq: Two times a day (BID) | ORAL | 0 refills | Status: DC

## 2019-03-05 MED ORDER — PHENAZOPYRIDINE HCL 100 MG PO TABS: 200.0000 mg | ORAL_TABLET | Freq: Three times a day (TID) | ORAL | 0 refills | Status: DC | PRN

## 2019-03-05 MED ORDER — CEFTRIAXONE SODIUM 1 G IJ SOLR
1.0000 g | Freq: Once | INTRAMUSCULAR | Status: AC
  Administered 2019-03-05: 1 g via INTRAMUSCULAR
  Filled 2019-03-05: qty 10

## 2019-03-05 MED ORDER — PHENAZOPYRIDINE HCL 200 MG PO TABS
200.0000 mg | ORAL_TABLET | Freq: Once | ORAL | Status: AC
  Administered 2019-03-05: 200 mg via ORAL

## 2019-03-05 MED ORDER — PHENAZOPYRIDINE HCL 100 MG PO TABS: 200.0000 mg | ORAL_TABLET | Freq: Three times a day (TID) | ORAL | 0 refills | Status: AC | PRN

## 2019-03-05 MED ORDER — CEPHALEXIN 500 MG PO CAPS: 500.0000 mg | ORAL_CAPSULE | Freq: Two times a day (BID) | ORAL | 0 refills | Status: AC

## 2019-03-05 NOTE — ED Triage Notes (Signed)
C?O dysuria, painful urination x 1 week.

## 2019-03-05 NOTE — ED Notes (Signed)
See triage note  States she noticed some dysuria about 2 weeks ago  Min relief with pyridium

## 2019-03-05 NOTE — ED Provider Notes (Signed)
[facility] Emergency Department Provider Note  ____________________________________________  Time seen: Approximately 6:33 PM  I have reviewed the triage vital signs and the nursing notes.   HISTORY  Chief Complaint Dysuria    HPI Melissa Mercer is a 23 y.o. female that presents to the emergency department for evaluation of dysuria for 1 week.  Patient states that she has occasionally had some low central back pain.  She has noticed some blood to her urine.  She has never had a kidney stone.  Patient took Azo 2 days ago.  No fever, nausea, vomiting, abdominal pain, flank pain.   History reviewed. No pertinent past medical history.  There are no active problems to display for this patient.   History reviewed. No pertinent surgical history.  Prior to Admission medications   Medication Sig Start Date End Date Taking? Authorizing Provider  cephALEXin (KEFLEX) 500 MG capsule Take 1 capsule (500 mg total) by mouth 2 (two) times daily for 10 days. 03/05/19 03/15/19  Enid DerryWagner, Nely, PA-C  phenazopyridine (PYRIDIUM) 100 MG tablet Take 2 tablets (200 mg total) by mouth 3 (three) times daily as needed for up to 2 days for pain. 03/05/19 03/07/19  Enid DerryWagner, Adaley, PA-C    Allergies Patient has no known allergies.  No family history on file.  Social History Social History   Tobacco Use  . Smoking status: Never Smoker  . Smokeless tobacco: Never Used  Substance Use Topics  . Alcohol use: No  . Drug use: Not on file     Review of Systems  Constitutional: No fever/chills Gastrointestinal: No abdominal pain.  No nausea, no vomiting.  Genitourinary: Positive for dysuria. Musculoskeletal: Negative for musculoskeletal pain. Skin: Negative for rash, abrasions, lacerations, ecchymosis.   ____________________________________________   PHYSICAL EXAM:  VITAL SIGNS: ED Triage Vitals  Enc Vitals Group     BP 03/05/19 1614 129/82     Pulse Rate 03/05/19 1614  69     Resp 03/05/19 1614 16     Temp 03/05/19 1614 98.3 F (36.8 C)     Temp Source 03/05/19 1614 Oral     SpO2 03/05/19 1614 99 %     Weight 03/05/19 1611 205 lb 0.4 oz (93 kg)     Height --      Head Circumference --      Peak Flow --      Pain Score 03/05/19 1611 0     Pain Loc --      Pain Edu? --      Excl. in GC? --      Constitutional: Alert and oriented. Well appearing and in no acute distress. Eyes: Conjunctivae are normal. PERRL. EOMI. Head: Atraumatic. ENT:      Ears:      Nose: No congestion/rhinnorhea.      Mouth/Throat: Mucous membranes are moist.  Neck: No stridor. Cardiovascular: Normal rate, regular rhythm.  Good peripheral circulation. Respiratory: Normal respiratory effort without tachypnea or retractions. Lungs CTAB. Good air entry to the bases with no decreased or absent breath sounds. Gastrointestinal: Bowel sounds 4 quadrants. Soft and nontender to palpation. No guarding or rigidity. No palpable masses. No distention. No CVA tenderness. Musculoskeletal: Full range of motion to all extremities. No gross deformities appreciated. Neurologic:  Normal speech and language. No gross focal neurologic deficits are appreciated.  Skin:  Skin is warm, dry and intact. No rash noted. Psychiatric: Mood and affect are normal. Speech and behavior are normal. Patient exhibits appropriate insight and  judgement.   ____________________________________________   LABS (all labs ordered are listed, but only abnormal results are displayed)  Labs Reviewed  URINALYSIS, COMPLETE (UACMP) WITH MICROSCOPIC - Abnormal; Notable for the following components:      Result Value   Color, Urine AMBER (*)    APPearance CLOUDY (*)    Specific Gravity, Urine 1.034 (*)    Hgb urine dipstick SMALL (*)    Bilirubin Urine SMALL (*)    Protein, ur 100 (*)    Nitrite POSITIVE (*)    Leukocytes,Ua MODERATE (*)    RBC / HPF >50 (*)    WBC, UA >50 (*)    Bacteria, UA FEW (*)    All other  components within normal limits  POC URINE PREG, ED  POCT PREGNANCY, URINE   ____________________________________________  EKG   ____________________________________________  RADIOLOGY   No results found.  ____________________________________________    PROCEDURES  Procedure(s) performed:    Procedures    Medications  cefTRIAXone (ROCEPHIN) injection 1 g (1 g Intramuscular Given 03/05/19 1820)  phenazopyridine (PYRIDIUM) tablet 200 mg (200 mg Oral Given 03/05/19 1820)     ____________________________________________   INITIAL IMPRESSION / ASSESSMENT AND PLAN / ED COURSE  Pertinent labs & imaging results that were available during my care of the patient were reviewed by me and considered in my medical decision making (see chart for details).  Review of the [place] CSRS was performed in accordance of the NCMB prior to dispensing any controlled drugs.     Patient's diagnosis is consistent with urinary tract infection.  Vital signs and exam are reassuring.  Urinalysis consistent with urinary tract infection.  Patient has never had a kidney stone.  Patient does have some blood in her urine but has no other symptoms of a kidney stone and patient would like to hold off on any additional imaging at this time.  Patient overall appears well.  She was given IM ceftriaxone for infection.  Patient will be discharged home with prescriptions for Keflex and Pyridium. Patient is to follow up with primary care as directed. Patient is given ED precautions to return to the ED for any worsening or new symptoms.   Melissa Mercer was evaluated in Emergency Department on 03/05/2019 for the symptoms described in the history of present illness. She was evaluated in the context of the global COVID-19 pandemic, which necessitated consideration that the patient might be at risk for infection with the SARS-CoV-2 virus that causes COVID-19. Institutional protocols and algorithms that pertain to the  evaluation of patients at risk for COVID-19 are in a state of rapid change based on information released by regulatory bodies including the CDC and federal and state organizations. These policies and algorithms were followed during the patient's care in the ED.  ____________________________________________  FINAL CLINICAL IMPRESSION(S) / ED DIAGNOSES  Final diagnoses:  Acute cystitis with hematuria      NEW MEDICATIONS STARTED DURING THIS VISIT:  ED Discharge Orders         Ordered    cephALEXin (KEFLEX) 500 MG capsule  2 times daily,   Status:  Discontinued     03/05/19 1818    phenazopyridine (PYRIDIUM) 100 MG tablet  3 times daily PRN,   Status:  Discontinued     03/05/19 1818    cephALEXin (KEFLEX) 500 MG capsule  2 times daily     03/05/19 1831    phenazopyridine (PYRIDIUM) 100 MG tablet  3 times daily PRN  03/05/19 1831              This chart was dictated using voice recognition software/Dragon. Despite best efforts to proofread, errors can occur which can change the meaning. Any change was purely unintentional.    Laban Emperor, PA-C 03/05/19 1847    Nena Polio, MD 03/05/19 2015

## 2019-05-27 ENCOUNTER — Telehealth: Payer: Self-pay | Admitting: Family Medicine

## 2019-05-27 NOTE — Telephone Encounter (Signed)
TC to patient who wants appointment for Nexplanon. States she has irregular bleeding with some very heavy periods and has used OC in the past for control of periods, but is not currently using BCM. Patient told that Nexplanon often causes irregular bleeding and patient decided she would like to come in for [facility] consult. Last PE was 01/19/18. Patient last unprotected sex was 05/25/2019. Patient scheduled for [facility] consult with understanding that she may not be able to start  [facility] at that appointment due to last unprotected sex.Jenetta Downer, RN

## 2019-05-27 NOTE — Telephone Encounter (Signed)
Patient wants appointment for IP and Nexplanon insertion. Patient was told we are not conducting physicals at this time due to Covid, but nurse will call pt back for appointment for [facility].

## 2019-05-28 DIAGNOSIS — E669 Obesity, unspecified: Secondary | ICD-10-CM

## 2019-05-29 ENCOUNTER — Ambulatory Visit: Payer: Self-pay

## 2019-05-30 ENCOUNTER — Encounter: Payer: Self-pay | Admitting: Advanced Practice Midwife

## 2019-05-30 ENCOUNTER — Ambulatory Visit (LOCAL_COMMUNITY_HEALTH_CENTER): Payer: Self-pay | Admitting: Advanced Practice Midwife

## 2019-05-30 ENCOUNTER — Other Ambulatory Visit: Payer: Self-pay

## 2019-05-30 VITALS — BP 119/76 | Ht 65.0 in | Wt 214.0 lb

## 2019-05-30 DIAGNOSIS — Z3009 Encounter for other general counseling and advice on contraception: Secondary | ICD-10-CM

## 2019-05-30 DIAGNOSIS — E669 Obesity, unspecified: Secondary | ICD-10-CM

## 2019-05-30 DIAGNOSIS — Z6835 Body mass index (BMI) 35.0-35.9, adult: Secondary | ICD-10-CM

## 2019-05-30 LAB — WET PREP FOR TRICH, YEAST, CLUE
Trichomonas Exam: NEGATIVE
Yeast Exam: NEGATIVE

## 2019-05-30 NOTE — Progress Notes (Signed)
Pt here as she desires to get on a BCM. Was on OCP's in the past and didn't have any issues with them but would like to discuss with provider about other birth control options to help regulate her period. Periods are usually really heavy. Pt reports period that was on 05/06/2019-05/23/2019 that was lighter than normal and was like spotting and brown, and then her period came back on 05/27/2019 and has been a normal flow. Last unprotected sex was 05/24/2019.Ronny Bacon, RN

## 2019-05-30 NOTE — Progress Notes (Signed)
Wet mount reviewed and no treatment needed per standing order. IUD consult completed and questions answered. Pt scheduled to come for IUD insertion 06/12/2019 per pt request and counseled to arrive 3:45pm for appt and pt states understanding. Counseled that from now until coming in for appt she should abstain from sex and pt states understanding. Provider orders completed.Ronny Bacon, RN

## 2019-05-30 NOTE — Progress Notes (Signed)
   [place] problem visit  [place] Department  Subjective:  Melissa Mercer is a 23 y.o. G2P2 nonsmoker being seen today for birth control  Chief Complaint  Patient presents with  . Contraception    desires to start [facility]    HPI Pt wants pregnancy but is worried she can't conceive because she may not be ovulating.  Pap due 09/2018, last CBE 09/2015, last pap 10/02/2015 neg.  LMP 05/27/19.  Last sex 05/24/19 without condom.  Wants Skyla  Does the patient have a current or past history of drug use? No   No components found for: HCV]   Health Maintenance Due  Topic Date Due  . PAP-Cervical Cytology Screening  09/28/2018  . PAP SMEAR-Modifier  09/28/2018  . INFLUENZA VACCINE  03/23/2019    ROS  The following portions of the patient's history were reviewed and updated as appropriate: allergies, current medications, past family history, past medical history, past social history, past surgical history and problem list. Problem list updated.   See flowsheet for other program required questions.  Objective:   Vitals:   05/30/19 0916  BP: 119/76  Weight: 214 lb (97.1 kg)  Height: 5\' 5"  (1.651 m)    Physical Exam    Assessment and Plan:  Melissa Mercer is a 23 y.o. female presenting to the [facility] Department for a Women's Health problem visit  1. Class 2 obesity with body mass index (BMI) of 35.0 to 35.9 in adult, unspecified obesity type, unspecified whether serious comorbidity present   2. Family planning Pt states she would like Skyla next week and will abstain until then.  Please give condoms and do IUD counseling and schedule apt  Treat wet mount per standing orders Immunization nurse consult - WET PREP FOR [place], YEAST, CLUE - Chlamydia/Gonorrhea [place] [facility] - IGP, rfx Aptima HPV ASCU     Return in about 1 week (around 06/06/2019).  No future appointments.  Herbie Saxon, CNM

## 2019-06-06 LAB — IGP, RFX APTIMA HPV ASCU: PAP Smear Comment: 0

## 2019-06-12 ENCOUNTER — Other Ambulatory Visit: Payer: Self-pay

## 2019-06-12 ENCOUNTER — Ambulatory Visit (LOCAL_COMMUNITY_HEALTH_CENTER): Payer: Self-pay | Admitting: Family Medicine

## 2019-06-12 VITALS — BP 99/63 | Ht 64.0 in | Wt 214.0 lb

## 2019-06-12 DIAGNOSIS — Z30430 Encounter for insertion of intrauterine contraceptive device: Secondary | ICD-10-CM

## 2019-06-12 DIAGNOSIS — Z3009 Encounter for other general counseling and advice on contraception: Secondary | ICD-10-CM

## 2019-06-12 LAB — PREGNANCY, URINE: Preg Test, Ur: NEGATIVE

## 2019-06-12 MED ORDER — LEVONORGESTREL 13.5 MG IU IUD
13.5000 mg | INTRAUTERINE_SYSTEM | Freq: Once | INTRAUTERINE | Status: AC
  Administered 2019-06-12: 17:00:00 13.5 mg via INTRAUTERINE

## 2019-06-12 NOTE — Progress Notes (Signed)
    Patient presented to [facility] for IUD insertion. Her GC/CT screening was found to be up to date and using WHO criteria we can be reasonably certain she is not pregnant or a pregnancy test was obtained which was Urine pregnancy test  today was Negative.  See Flowsheet for IUD check list  IUD Insertion Procedure Note Patient identified, informed consent performed, consent signed.   Discussed risks of irregular bleeding, cramping, infection, malpositioning or misplacement of the IUD outside the uterus which may require further procedure such as laparoscopy. Time out was performed.    Speculum placed in the vagina.  Cervix visualized.  Cleaned with Betadine x 2.  Grasped anteriorly with a single tooth tenaculum.  Uterus sounded to 9 cm.  IUD placed per manufacturer's recommendations.  Strings trimmed to 3 cm. Tenaculum was removed, good hemostasis noted.  Patient tolerated procedure well.   Patient was given post-procedure instructions- both agency handout and verbally by provider.  She was advised to have backup contraception for one week.  Patient was also asked to check IUD strings periodically or follow up in 4 weeks for IUD check. 

## 2019-06-12 NOTE — Progress Notes (Signed)
In for [facility] insertion; consents signed Debera Lat, RN

## 2019-06-14 ENCOUNTER — Emergency Department
Admission: EM | Admit: 2019-06-14 | Discharge: 2019-06-14 | Disposition: A | Discharge status: Home / Self Care | Payer: Self-pay | Attending: Emergency Medicine | Admitting: Emergency Medicine

## 2019-06-14 ENCOUNTER — Encounter: Payer: Self-pay | Admitting: Emergency Medicine

## 2019-06-14 ENCOUNTER — Other Ambulatory Visit: Payer: Self-pay

## 2019-06-14 DIAGNOSIS — J069 Acute upper respiratory infection, unspecified: Secondary | ICD-10-CM | POA: Insufficient documentation

## 2019-06-14 DIAGNOSIS — B9789 Other viral agents as the cause of diseases classified elsewhere: Secondary | ICD-10-CM | POA: Insufficient documentation

## 2019-06-14 DIAGNOSIS — Z20828 Contact with and (suspected) exposure to other viral communicable diseases: Secondary | ICD-10-CM | POA: Insufficient documentation

## 2019-06-14 LAB — SARS CORONAVIRUS 2 (TAT 6-24 HRS): SARS Coronavirus 2: NEGATIVE

## 2019-06-14 MED ORDER — FLUTICASONE PROPIONATE 50 MCG/ACT NA SUSP: 2.0000 | Freq: Every day | NASAL | 0 refills | Status: DC

## 2019-06-14 MED ORDER — CETIRIZINE HCL 10 MG PO TABS: 10.0000 mg | ORAL_TABLET | Freq: Every day | ORAL | 0 refills | Status: DC

## 2019-06-14 NOTE — ED Notes (Signed)
See triage note  Presents with scratchy throat for couple of days     Nasal congestion/ drainage   Chills  No fever

## 2019-06-14 NOTE — ED Provider Notes (Signed)
[facility] Emergency Department Provider Note  ____________________________________________  Time seen: Approximately 10:51 AM  I have reviewed the triage vital signs and the nursing notes.   HISTORY  Chief Complaint Nasal Congestion    HPI Melissa Mercer is a 23 y.o. female that presents to the emergency department for evaluation of of nasal congestion, rhinorrhea, postnasal drip, scratchy throat, infrequent nonproductive cough for 2 days.  Patient had an IUD placed 3 days ago.  She has several coworkers with COVID-19.  She is currently on her menstrual cycle.  No fevers, shortness of breath, chest pain, vomiting, abdominal pain, vaginal discharge.  Past Medical History:  Diagnosis Date  . Anemia   . Blood transfusion without reported diagnosis     Patient Active Problem List   Diagnosis Date Noted  . Low birth weight infant 06/20/2016  . Obesity, unspecified 09/29/2015    History reviewed. No pertinent surgical history.  Prior to Admission medications   Medication Sig Start Date End Date Taking? Authorizing Provider  cetirizine (ZYRTEC ALLERGY) 10 MG tablet Take 1 tablet (10 mg total) by mouth daily. 06/14/19   Laban Emperor, PA-C  fluticasone (FLONASE) 50 MCG/ACT nasal spray Place 2 sprays into both nostrils daily. 06/14/19 06/13/20  Laban Emperor, PA-C    Allergies Patient has no known allergies.  Family History  Problem Relation Age of Onset  . Hypertension Mother   . Diabetes Maternal Grandmother   . Heart disease Maternal Grandmother   . Hypertension Maternal Grandmother     Social History Social History   Tobacco Use  . Smoking status: Never Smoker  . Smokeless tobacco: Never Used  Substance Use Topics  . Alcohol use: No  . Drug use: Not on file     Review of Systems  Constitutional: No fever/chills Eyes: No visual changes. No discharge. ENT: Positive for congestion and rhinorrhea. Cardiovascular: No chest  pain. Respiratory: Positive for infrequent cough. No SOB. Gastrointestinal: No abdominal pain.  No nausea, no vomiting.   Musculoskeletal: Negative for musculoskeletal pain. Skin: Negative for rash, abrasions, lacerations, ecchymosis. Neurological: Negative for headaches.   ____________________________________________   PHYSICAL EXAM:  VITAL SIGNS: ED Triage Vitals  Enc Vitals Group     BP 06/14/19 1024 122/80     Pulse Rate 06/14/19 1024 (!) 102     Resp 06/14/19 1024 18     Temp 06/14/19 1024 99.3 F (37.4 C)     Temp Source 06/14/19 1024 Oral     SpO2 06/14/19 1024 98 %     Weight 06/14/19 1022 214 lb (97.1 kg)     Height 06/14/19 1022 5\' 4"  (1.626 m)     Head Circumference --      Peak Flow --      Pain Score 06/14/19 1021 0     Pain Loc --      Pain Edu? --      Excl. in [place]? --      Constitutional: Alert and oriented. Well appearing and in no acute distress. Eyes: Conjunctivae are normal. PERRL. EOMI. No discharge. Head: Atraumatic. ENT: No frontal and maxillary sinus tenderness.      Ears: Tympanic membranes pearly gray with good landmarks. No discharge.      Nose: Mild congestion/rhinnorhea.      Mouth/Throat: Mucous membranes are moist. Oropharynx non-erythematous. Tonsils not enlarged. No exudates. Uvula midline. Neck: No stridor.   Hematological/Lymphatic/Immunilogical: No cervical lymphadenopathy. Cardiovascular: Normal rate, regular rhythm.  Good peripheral circulation. Respiratory: Normal respiratory  effort without tachypnea or retractions. Lungs CTAB. Good air entry to the bases with no decreased or absent breath sounds. Gastrointestinal: Bowel sounds 4 quadrants. Soft and nontender to palpation. No guarding or rigidity. No palpable masses. No distention. Musculoskeletal: Full range of motion to all extremities. No gross deformities appreciated. Neurologic:  Normal speech and language. No gross focal neurologic deficits are appreciated.  Skin:  Skin is  warm, dry and intact. No rash noted. Psychiatric: Mood and affect are normal. Speech and behavior are normal. Patient exhibits appropriate insight and judgement.   ____________________________________________   LABS (all labs ordered are listed, but only abnormal results are displayed)  Labs Reviewed  SARS CORONAVIRUS 2 (TAT 6-24 HRS)   ____________________________________________  EKG   ____________________________________________  RADIOLOGY   No results found.  ____________________________________________    PROCEDURES  Procedure(s) performed:    Procedures    Medications - No data to display   ____________________________________________   INITIAL IMPRESSION / ASSESSMENT AND PLAN / ED COURSE  Pertinent labs & imaging results that were available during my care of the patient were reviewed by me and considered in my medical decision making (see chart for details).  Review of the [place] CSRS was performed in accordance of the NCMB prior to dispensing any controlled drugs.   Patient's diagnosis is consistent with viral URI. Vital signs and exam are reassuring.  Covid test is pending.  Patient appears well and is staying well hydrated.  Patient feels comfortable going home. Patient will be discharged home with prescriptions for Flonase and Zyrtec. Patient is to follow up with health department as needed or otherwise directed. Patient is given ED precautions to return to the ED for any worsening or new symptoms.   Melissa Mercer was evaluated in Emergency Department on 06/14/2019 for the symptoms described in the history of present illness. She was evaluated in the context of the global COVID-19 pandemic, which necessitated consideration that the patient might be at risk for infection with the SARS-CoV-2 virus that causes COVID-19. Institutional protocols and algorithms that pertain to the evaluation of patients at risk for COVID-19 are in a state of rapid change  based on information released by regulatory bodies including the CDC and federal and state organizations. These policies and algorithms were followed during the patient's care in the ED.  ____________________________________________  FINAL CLINICAL IMPRESSION(S) / ED DIAGNOSES  Final diagnoses:  Viral URI      NEW MEDICATIONS STARTED DURING THIS VISIT:  ED Discharge Orders         Ordered    fluticasone (FLONASE) 50 MCG/ACT nasal spray  Daily     06/14/19 1120    cetirizine (ZYRTEC ALLERGY) 10 MG tablet  Daily     06/14/19 1120              This chart was dictated using voice recognition software/Dragon. Despite best efforts to proofread, errors can occur which can change the meaning. Any change was purely unintentional.    Enid Derry, PA-C 06/14/19 1433    Jene Every, MD 06/14/19 1505

## 2019-06-14 NOTE — ED Notes (Signed)
First Nurse Note: Pt to ED for weakness, sinus drainage, and runny nose. Pt is in NAD at this time. Pt drinking large soda on arrival.

## 2019-06-14 NOTE — ED Triage Notes (Signed)
Pt reports got IUD at health dept 2 days ago and started after with nasal congestion and posterior nasal drainage.  Pt denies sore throat but had some chills. Unlabored currently, VSS.  Pt reports some coworkers have had covid.  No pain at all. Dry occasional cough.

## 2019-08-01 ENCOUNTER — Telehealth: Payer: Self-pay | Admitting: General Practice

## 2019-08-01 NOTE — Telephone Encounter (Signed)
IUD COMPLICATIONS

## 2019-08-02 NOTE — Telephone Encounter (Signed)
Phone call to pt. Pt c/o of bleeding and itching and wants IUD out. Last sex was over a week ago, probably months ago. Pt desires OCP. Scheduled for IUD removal 08/07/2019.

## 2019-08-07 ENCOUNTER — Ambulatory Visit (LOCAL_COMMUNITY_HEALTH_CENTER): Payer: Self-pay | Admitting: Family Medicine

## 2019-08-07 ENCOUNTER — Encounter: Payer: Self-pay | Admitting: Family Medicine

## 2019-08-07 ENCOUNTER — Other Ambulatory Visit: Payer: Self-pay

## 2019-08-07 VITALS — BP 103/71 | Ht 64.0 in | Wt 207.4 lb

## 2019-08-07 DIAGNOSIS — N760 Acute vaginitis: Secondary | ICD-10-CM

## 2019-08-07 DIAGNOSIS — Z30432 Encounter for removal of intrauterine contraceptive device: Secondary | ICD-10-CM

## 2019-08-07 DIAGNOSIS — Z30011 Encounter for initial prescription of contraceptive pills: Secondary | ICD-10-CM

## 2019-08-07 DIAGNOSIS — Z113 Encounter for screening for infections with a predominantly sexual mode of transmission: Secondary | ICD-10-CM

## 2019-08-07 DIAGNOSIS — B9689 Other specified bacterial agents as the cause of diseases classified elsewhere: Secondary | ICD-10-CM

## 2019-08-07 LAB — WET PREP FOR TRICH, YEAST, CLUE
Trichomonas Exam: NEGATIVE
Yeast Exam: NEGATIVE

## 2019-08-07 MED ORDER — NORGESTIMATE-ETH ESTRADIOL 0.25-35 MG-MCG PO TABS: 1.0000 | ORAL_TABLET | Freq: Every day | ORAL | 0 refills | Status: DC

## 2019-08-07 MED ORDER — METRONIDAZOLE 500 MG PO TABS: 1000.0000 mg | ORAL_TABLET | Freq: Once | ORAL | 0 refills | Status: AC

## 2019-08-07 NOTE — Progress Notes (Signed)
[place]et [place] results reviewed. Patient treated for BV per standing orders. Ritha Sampedro, RN  

## 2019-08-07 NOTE — Progress Notes (Signed)
Family Planning Visit- Repeat Yearly Visit  Subjective:  Melissa Mercer is a 23 y.o. being seen today for an well woman visit and to discuss family planning options.    She is currently using IUD for pregnancy prevention. Patient reports she does not  or her partner wants a pregnancy in the next year. Patient  has Low birth weight infant and Obesity, unspecified on their problem list.  Chief Complaint  Patient presents with  . Procedure    IUD removed  . Contraception    Ocp's    Patient reports that she is here for IUD removal due to having bleeding since 01/2019.  She would like to start OCPs.  She would also like to haves STD screen due to she had a small amount of disch with her bleeding and has mild genital itching/irritation.    Does the patient desire a pregnancy in the next year? (OKQ flowsheet)No  See flowsheet for other program required questions.   Body mass index is 35.6 kg/m. - Patient is eligible for diabetes screening based on BMI and age >47?  not applicable HA1C ordered? not applicable  Patient reports 1 of partners in last year. Desires STI screening?  Yes  Does the patient have a current or past history of drug use? No   No components found for: HCV]   Health Maintenance Due  Topic Date Due  . PAP-Cervical Cytology Screening  09/28/2018  . INFLUENZA VACCINE  03/23/2019    ROS  The following portions of the patient's history were reviewed and updated as appropriate: allergies, current medications, past family history, past medical history, past social history, past surgical history and problem list. Problem list updated.  Objective:   Vitals:   08/07/19 0946  BP: 103/71  Weight: 207 lb 6.4 oz (94.1 kg)  Height: 5\' 4"  (1.626 m)    Physical Exam HENT:     Mouth/Throat:     Mouth: Mucous membranes are moist.     Pharynx: Oropharynx is clear. No oropharyngeal exudate or posterior oropharyngeal erythema.  Abdominal:     Palpations: Abdomen is  soft.     Tenderness: There is no abdominal tenderness.     Hernia: There is no hernia in the left inguinal area or right inguinal area.  Genitourinary:    Labia:        Right: No rash, tenderness or lesion.        Left: No rash, tenderness or lesion.      Vagina: Vaginal discharge and bleeding present. No tenderness or lesions.     Cervix: No cervical motion tenderness or erythema.     Uterus: Normal. Not tender.      Adnexa: Right adnexa normal and left adnexa normal.     Comments: Thick white and dk red discharge adhering to vaginal walls, pH 4.5, + fishy odor noted. Musculoskeletal:     Cervical back: No tenderness.  Lymphadenopathy:     Cervical: No cervical adenopathy.     Lower Body: No right inguinal adenopathy. No left inguinal adenopathy.  Skin:    General: Skin is warm.     Findings: No erythema, lesion or rash.  Neurological:     Mental Status: She is alert.  Psychiatric:        Mood and Affect: Mood normal.    Assessment and Plan:  Melissa Mercer is a 23 y.o. female presenting to the [facility] Department for an/family planning visit to have her IUD removed and  to start OCPs.  Contraception counseling: Reviewed all forms of birth control options in the tiered based approach. available including abstinence; over the counter/barrier methods; hormonal contraceptive medication including pill, patch, ring, injection,contraceptive implant; hormonal and nonhormonal IUDs; permanent sterilization options including vasectomy and the various tubal sterilization modalities. Risks, benefits, and typical effectiveness rates were reviewed.  Questions were answered.  Written information was also given to the patient to review.  Patient desires Sprintec, this was prescribed for patient. She will follow up in  3-4 months for her annual/surveillance.  She was told to call with any further questions, or with any concerns about this method of contraception.  Emphasized use of  condoms 100% of the time for STI prevention. ECP counseling was not given - see RN documentation   Patient desires Sprintec, this was prescribed for patient. She will follow up in  3-4 months for refill surveillance.  Patient was counseled on the side effect of the method chosen, the correct use of her desired method and how to discontinue in the future. She was told to call with any further questions, or with any concerns about this method of contraception.  Emphasized use of condoms 100% of the time for STI prevention.  1. Screening examination for venereal disease  - WET PREP FOR TRICH, YEAST, CLUE - Chlamydia/Gonorrhea [facility] Treat wet prep for BV with Metronidazole 1000 mg po once.  2. Encounter for IUD removal  IUD Removal  Patient identified, informed consent performed, consent signed.  Patient was in the dorsal lithotomy position, normal external genitalia was noted.  A speculum was placed in the patient's vagina, normal discharge was noted, no lesions. The cervix was visualized, no lesions, dark blood discharge was noted.  The strings of the IUD were grasped and pulled using ring forceps. The IUD was removed in its entirety.  Patient tolerated the procedure well.    Patient will use Sprintec-extended cycle for contraception.  Routine preventative health maintenance measures emphasized.   3. OCP (oral contraceptive pills) initiation  - norgestimate-ethinyl estradiol (ORTHO-CYCLEN) 0.25-35 MG-MCG tablet; Take 1 active tablet po daily x 12 weeks then 1 week of inactive tablets the 13th week.  Dispense: 4 Package; Refill: 0  (extended cycle)   Co to make Annual exam and for continuation of Sprintec RX in 3-4 mos.  Enc client to call when she open her 3rd pack of pills for an appt.     No follow-ups on file.  No future appointments.  Hassell Done, FNP

## 2019-08-07 NOTE — Progress Notes (Signed)
Here today to have IUD removed and would like to start Ocp's. Last PE 01/19/2018, last Pap Smear 05/30/2019. Hal Morales, RN

## 2019-10-23 ENCOUNTER — Encounter: Payer: Self-pay | Admitting: Advanced Practice Midwife

## 2019-10-23 ENCOUNTER — Other Ambulatory Visit: Payer: Self-pay

## 2019-10-23 ENCOUNTER — Ambulatory Visit (LOCAL_COMMUNITY_HEALTH_CENTER): Payer: BC Managed Care – PPO | Admitting: Advanced Practice Midwife

## 2019-10-23 VITALS — BP 127/91 | Ht 64.0 in | Wt 196.2 lb

## 2019-10-23 DIAGNOSIS — Z30011 Encounter for initial prescription of contraceptive pills: Secondary | ICD-10-CM | POA: Diagnosis not present

## 2019-10-23 DIAGNOSIS — Z3041 Encounter for surveillance of contraceptive pills: Secondary | ICD-10-CM

## 2019-10-23 DIAGNOSIS — Z3200 Encounter for pregnancy test, result unknown: Secondary | ICD-10-CM

## 2019-10-23 DIAGNOSIS — Z3009 Encounter for other general counseling and advice on contraception: Secondary | ICD-10-CM | POA: Diagnosis not present

## 2019-10-23 MED ORDER — NORGESTIMATE-ETH ESTRADIOL 0.25-35 MG-MCG PO TABS: 1.0000 | ORAL_TABLET | Freq: Every day | ORAL | 0 refills | Status: DC

## 2019-10-23 NOTE — Progress Notes (Addendum)
Patient here today for PT and to discuss birth control. Last PE here was 01/19/2018 and last Pap Smear was 05/30/2019. Had IUD removed 08/12/2019. Was Rx'd OCP's after IUD removal.  Tawny Hopping, RN

## 2019-10-23 NOTE — Progress Notes (Signed)
PT negative. OCP's Rx'd per provider order. Tawny Hopping, RN

## 2019-10-23 NOTE — Progress Notes (Signed)
   [facility] problem visit  [facility]  Subjective:  Melissa Mercer is a 24 y.o.SBF G2P2 nonsmoker being seen today for more continuous dosing ocp and wants PT  No chief complaint on file.   HPI Last physical 01/19/18.   Last pap 05/30/2019.  IUD placed 06/12/19 and removed 08/12/19 and given continuous dosing of Sprintec which she began that day.  Last sex 10/21/19 without condom.  BP 127/91 and on repeat 123/77.  PT neg.  Does the patient have a current or past history of drug use? No   No components found for: HCV]   Health Maintenance Due  Topic Date Due  . PAP-Cervical Cytology Screening  09/28/2018  . INFLUENZA VACCINE  03/23/2019    ROS  The following portions of the patient's history were reviewed and updated as appropriate: allergies, current medications, past family history, past medical history, past social history, past surgical history and problem list. Problem list updated.   See flowsheet for other program required questions.  Objective:   Vitals:   10/23/19 1541  BP: (!) 127/91  Weight: 196 lb 3.2 oz (89 kg)  Height: 5\' 4"  (1.626 m)    Physical Exam  n/a  Assessment and Plan:  Melissa Mercer is a 24 y.o. female presenting to the [facility] Department for a Women's Health problem visit  1. Encounter for pregnancy test, result unknown PT neg - Pregnancy, urine  2. Family planning Happy with ocp's and wants more.  3. Encounter for surveillance of contraceptive pills  Sprintec continuous dosing #4 packs.  Then will need BP check to get more ocp's    Return in about 3 months (around 01/23/2020) for BP check with more ocp's.  No future appointments.  03/24/2020, CNM

## 2019-10-24 LAB — PREGNANCY, URINE: Preg Test, Ur: NEGATIVE

## 2019-12-03 ENCOUNTER — Other Ambulatory Visit: Payer: Self-pay

## 2019-12-03 ENCOUNTER — Emergency Department
Admission: EM | Admit: 2019-12-03 | Discharge: 2019-12-03 | Disposition: A | Discharge status: Home / Self Care | Payer: BC Managed Care – PPO | Attending: Student | Admitting: Student

## 2019-12-03 DIAGNOSIS — R21 Rash and other nonspecific skin eruption: Secondary | ICD-10-CM | POA: Diagnosis present

## 2019-12-03 DIAGNOSIS — B379 Candidiasis, unspecified: Secondary | ICD-10-CM | POA: Diagnosis not present

## 2019-12-03 LAB — GLUCOSE, CAPILLARY: Glucose-Capillary: 86 mg/dL (ref 70–99)

## 2019-12-03 MED ORDER — CLOTRIMAZOLE-BETAMETHASONE 1-0.05 % EX CREA: TOPICAL_CREAM | CUTANEOUS | 1 refills | Status: AC

## 2019-12-03 MED ORDER — FLUCONAZOLE 150 MG PO TABS: ORAL_TABLET | ORAL | 0 refills | Status: DC

## 2019-12-03 NOTE — Discharge Instructions (Signed)
Keep the area as dry as possible, use medication as prescribed

## 2019-12-03 NOTE — ED Triage Notes (Signed)
Pt states she was having issues with having a period for 55months and was placed on birth control but due to having to wear large pads for so long she developed a rash and states she has been using creams and other things but with no relief.

## 2019-12-03 NOTE — ED Notes (Signed)
See triage note  Presents with rash and irritation to vaginal area  Provider at bedside

## 2019-12-03 NOTE — ED Provider Notes (Signed)
[facility] Emergency Department Provider Note  ____________________________________________   First MD Initiated Contact with Patient 12/03/19 1005     (approximate)  I have reviewed the triage vital signs and the nursing notes.   HISTORY  Chief Complaint Rash    HPI Melissa Mercer is a 24 y.o. female presents emergency department with concerns of a rash on her buttocks for 2 weeks. She states she was having heavy periods for about 5 months and had to be put on birth control. She was wearing very large pads which created a lot of moisture. States area has become very irritated and painful. Feels that it is chafing also when she walks. No fever or chills. No drainage from the area.    Past Medical History:  Diagnosis Date  . Anemia   . Blood transfusion without reported diagnosis     Patient Active Problem List   Diagnosis Date Noted  . Low birth weight infant 06/20/2016  . Obesity, unspecified BMI=33.6 09/29/2015    History reviewed. No pertinent surgical history.  Prior to Admission medications   Medication Sig Start Date End Date Taking? Authorizing Provider  clotrimazole-betamethasone (LOTRISONE) cream Apply to affected area 2 times daily 12/03/19 12/02/20  Sherrie Mustache, Roselyn Bering, PA-C  fluconazole (DIFLUCAN) 150 MG tablet Take one now and one in a week repeat if yeast still present in 1 week after second dose 12/03/19   Eva Griffo, Roselyn Bering, PA-C  norgestimate-ethinyl estradiol (ORTHO-CYCLEN) 0.25-35 MG-MCG tablet Take 1 tablet by mouth daily for 28 days. 10/23/19 11/20/19  Alberteen Spindle, CNM    Allergies Patient has no known allergies.  Family History  Problem Relation Age of Onset  . Hypertension Mother   . Diabetes Maternal Grandmother   . Heart disease Maternal Grandmother   . Hypertension Maternal Grandmother     Social History Social History   Tobacco Use  . Smoking status: Never Smoker  . Smokeless tobacco: Never Used  Substance  Use Topics  . Alcohol use: No  . Drug use: Not on file    Review of Systems  Constitutional: No fever/chills Eyes: No visual changes. ENT: No sore throat. Respiratory: Denies cough Cardiovascular: Denies chest pain Gastrointestinal: Denies abdominal pain Genitourinary: Negative for dysuria. Musculoskeletal: Negative for back pain. Skin: Positive for rash. Psychiatric: no mood changes,     ____________________________________________   PHYSICAL EXAM:  VITAL SIGNS: ED Triage Vitals  Enc Vitals Group     BP 12/03/19 0826 (!) 156/99     Pulse Rate 12/03/19 0826 91     Resp 12/03/19 0826 16     Temp 12/03/19 0826 98.3 F (36.8 C)     Temp Source 12/03/19 0826 Oral     SpO2 12/03/19 0826 100 %     Weight 12/03/19 0827 190 lb (86.2 kg)     Height 12/03/19 0827 5\' 4"  (1.626 m)     Head Circumference --      Peak Flow --      Pain Score 12/03/19 0827 8     Pain Loc --      Pain Edu? --      Excl. in GC? --     Constitutional: Alert and oriented. Well appearing and in no acute distress. Eyes: Conjunctivae are normal.  Head: Atraumatic. Nose: No congestion/rhinnorhea. Mouth/Throat: Mucous membranes are moist.   Neck:  supple no lymphadenopathy noted Cardiovascular: Normal rate, regular rhythm.  Respiratory: Normal respiratory effort.  No retractions GU: deferred Musculoskeletal: FROM  all extremities, warm and well perfused Neurologic:  Normal speech and language.  Skin:  Skin is warm, dry, a slight rash noted between the buttocks posteriorly, area extends up into the lower, some ulcerations noted, no abscess or drainage noted Psychiatric: Mood and affect are normal. Speech and behavior are normal.  ____________________________________________   LABS (all labs ordered are listed, but only abnormal results are displayed)  Labs Reviewed  CBG MONITORING, ED    ____________________________________________   ____________________________________________  RADIOLOGY    ____________________________________________   PROCEDURES  Procedure(s) performed: No  Procedures    ____________________________________________   INITIAL IMPRESSION / ASSESSMENT AND PLAN / ED COURSE  Pertinent labs & imaging results that were available during my care of the patient were reviewed by me and considered in my medical decision making (see chart for details).   Patient is a 24 year old female presents emergency department for rash. Rash shows yeastlike. Explained all findings to the patient. She was given a prescription for Lotrisone and Diflucan. We also did a POC fingerstick to ensure she is not diabetic. COBG is normal at 86.  She is to follow-up with her regular doctor if not improving in 3 days. She is discharged stable condition.    Melissa Mercer was evaluated in Emergency Department on 12/03/2019 for the symptoms described in the history of present illness. She was evaluated in the context of the global COVID-19 pandemic, which necessitated consideration that the patient might be at risk for infection with the SARS-CoV-2 virus that causes COVID-19. Institutional protocols and algorithms that pertain to the evaluation of patients at risk for COVID-19 are in a state of rapid change based on information released by regulatory bodies including the CDC and federal and state organizations. These policies and algorithms were followed during the patient's care in the ED.   As part of my medical decision making, I reviewed the following data within the [place] notes reviewed and incorporated, Labs reviewed , Old chart reviewed, Notes from prior ED visits and [place] Controlled Substance Database  ____________________________________________   FINAL CLINICAL IMPRESSION(S) / ED DIAGNOSES  Final diagnoses:  Candidiasis      NEW  MEDICATIONS STARTED DURING THIS VISIT:  New Prescriptions   CLOTRIMAZOLE-BETAMETHASONE (LOTRISONE) CREAM    Apply to affected area 2 times daily   FLUCONAZOLE (DIFLUCAN) 150 MG TABLET    Take one now and one in a week repeat if yeast still present in 1 week after second dose     Note:  This document was prepared using Dragon voice recognition software and may include unintentional dictation errors.    Versie Starks, PA-C 12/03/19 1055    Lilia Pro., MD 12/03/19 1120

## 2020-01-23 ENCOUNTER — Other Ambulatory Visit: Payer: Self-pay

## 2020-01-23 ENCOUNTER — Ambulatory Visit (LOCAL_COMMUNITY_HEALTH_CENTER): Payer: BC Managed Care – PPO

## 2020-01-23 VITALS — BP 108/75 | Ht 64.0 in | Wt 194.0 lb

## 2020-01-23 DIAGNOSIS — Z30011 Encounter for initial prescription of contraceptive pills: Secondary | ICD-10-CM

## 2020-01-23 DIAGNOSIS — Z3041 Encounter for surveillance of contraceptive pills: Secondary | ICD-10-CM

## 2020-01-23 DIAGNOSIS — Z3009 Encounter for other general counseling and advice on contraception: Secondary | ICD-10-CM

## 2020-01-23 MED ORDER — NORGESTIMATE-ETH ESTRADIOL 0.25-35 MG-MCG PO TABS: 1.0000 | ORAL_TABLET | Freq: Every day | ORAL | 2 refills | Status: DC

## 2020-01-23 NOTE — Addendum Note (Signed)
Addended by: Sadie Haber on: 01/23/2020 01:09 PM   Modules accepted: Orders

## 2020-01-23 NOTE — Progress Notes (Signed)
Pt. Here for ocp and bp recheck. BP 108/75 today. Pt. Desires to continue  Ortho cyclen - continuous dosing from order 10/23/19. Consulted with Beatris Si, PA today. Reviewed pt. Details with provider. Provider to send RX for Ortho Cyclen 12 packs to be used for continuous dosing to pt's pharmacy- [facility] [address] Pt. Has [facility]. Jerel Shepherd, RN

## 2020-01-23 NOTE — Progress Notes (Signed)
Consulted by RN re:  Patient request for more OCs.  Patient taking Sprintec/Ortho Cyclen as continuous dosing and doing well.  BP today is normal.  Will send refills for patient to continue with continuous dosing of OCs.

## 2020-07-15 ENCOUNTER — Other Ambulatory Visit: Payer: Self-pay | Admitting: Family Medicine

## 2020-07-15 NOTE — Telephone Encounter (Signed)
Call to patient. No answer. LMOM to return call. Kailan Laws, RN  

## 2020-07-15 NOTE — Telephone Encounter (Signed)
I lost my BC pills need a refill

## 2020-07-20 NOTE — Telephone Encounter (Signed)
Call to patient, she thought she lost her pills but have found them.   Harvie Heck, RN

## 2020-08-28 ENCOUNTER — Emergency Department
Admission: EM | Admit: 2020-08-28 | Discharge: 2020-08-28 | Disposition: A | Discharge status: Home / Self Care | Payer: BC Managed Care – PPO | Attending: Emergency Medicine | Admitting: Emergency Medicine

## 2020-08-28 ENCOUNTER — Telehealth: Payer: Self-pay | Admitting: *Deleted

## 2020-08-28 ENCOUNTER — Encounter: Payer: Self-pay | Admitting: Emergency Medicine

## 2020-08-28 ENCOUNTER — Other Ambulatory Visit: Payer: Self-pay

## 2020-08-28 DIAGNOSIS — U071 COVID-19: Secondary | ICD-10-CM | POA: Insufficient documentation

## 2020-08-28 DIAGNOSIS — R519 Headache, unspecified: Secondary | ICD-10-CM | POA: Diagnosis present

## 2020-08-28 DIAGNOSIS — Z20822 Contact with and (suspected) exposure to covid-19: Secondary | ICD-10-CM

## 2020-08-28 LAB — SARS CORONAVIRUS 2 (TAT 6-24 HRS): SARS Coronavirus 2: POSITIVE — AB

## 2020-08-28 NOTE — ED Notes (Signed)
AAOx3.  Skin warm and dry.  NAD 

## 2020-08-28 NOTE — Telephone Encounter (Signed)
Patient called for results advised they are still pending .

## 2020-08-28 NOTE — ED Triage Notes (Signed)
Fever and body aches x 1 day.  AAOx3.  Skin warm and dry. NAD

## 2020-09-02 NOTE — ED Provider Notes (Signed)
[facility] Emergency Department Provider Note   ____________________________________________    I have reviewed the triage vital signs and the nursing notes.   HISTORY  Chief Complaint Fever     HPI Melissa Mercer is a 25 y.o. female who presents with viral symptoms consistent with COVID-19 including body aches, chills, headache, fatigue.  Has taken over-the-counter medications with little improvement.  Has not been tested.     Past Medical History:  Diagnosis Date  . Anemia   . Blood transfusion without reported diagnosis     Patient Active Problem List   Diagnosis Date Noted  . Low birth weight infant 06/20/2016  . Obesity, unspecified BMI=33.6 09/29/2015    History reviewed. No pertinent surgical history.  Prior to Admission medications   Medication Sig Start Date End Date Taking? Authorizing Provider  clotrimazole-betamethasone (LOTRISONE) cream Apply to affected area 2 times daily Patient not taking: Reported on 01/23/2020 12/03/19 12/02/20  Sherrie Mustache Roselyn Bering, PA-C  fluconazole (DIFLUCAN) 150 MG tablet Take one now and one in a week repeat if yeast still present in 1 week after second dose Patient not taking: Reported on 01/23/2020 12/03/19   Faythe Ghee, PA-C  norgestimate-ethinyl estradiol (ORTHO-CYCLEN) 0.25-35 MG-MCG tablet Take 1 tablet by mouth daily for 28 days. 10/23/19 11/20/19  Sciora, Austin Miles, CNM  norgestimate-ethinyl estradiol (SPRINTEC 28) 0.25-35 MG-MCG tablet Take 1 tablet by mouth daily. Take 1 pill daily at the same time each day for continuous dosing. 01/23/20   Matt Holmes, PA     Allergies Patient has no known allergies.  Family History  Problem Relation Age of Onset  . Hypertension Mother   . Diabetes Maternal Grandmother   . Heart disease Maternal Grandmother   . Hypertension Maternal Grandmother     Social History Social History   Tobacco Use  . Smoking status: Never Smoker  . Smokeless tobacco:  Never Used  Substance Use Topics  . Alcohol use: No    Review of Systems  Constitutional: Chills, fatigue  ENT: Positive congestion   Gastrointestinal: No abdominal pain.    Musculoskeletal: Myalgias Skin: Negative for rash. Neurological: Headaches    ____________________________________________   PHYSICAL EXAM:  VITAL SIGNS: ED Triage Vitals [08/28/20 0844]  Enc Vitals Group     BP 123/86     Pulse Rate (!) 112     Resp 16     Temp 99.4 F (37.4 C)     Temp Source Oral     SpO2 99 %     Weight 87.5 kg (193 lb)     Height 1.626 m (5\' 4" )     Head Circumference      Peak Flow      Pain Score 6     Pain Loc      Pain Edu?      Excl. in GC?     Constitutional: Alert and oriented. No acute distress. Pleasant and interactive Eyes: Conjunctivae are normal.  Head: Atraumatic. Nose: Mild congestion Mouth/Throat: Mucous membranes are moist.   Cardiovascular: Normal rate, regular rhythm.   Musculoskeletal: No lower extremity tenderness nor edema.   Neurologic:  Normal speech and language. No gross focal neurologic deficits are appreciated.   Skin:  Skin is warm, dry and intact. No rash noted.   ____________________________________________   LABS (all labs ordered are listed, but only abnormal results are displayed)  Labs Reviewed  SARS CORONAVIRUS 2 (TAT 6-24 HRS) - Abnormal; Notable for the  following components:      Result Value   SARS Coronavirus 2 POSITIVE (*)    All other components within normal limits   ____________________________________________  EKG   ____________________________________________  RADIOLOGY  None ____________________________________________   PROCEDURES  Procedure(s) performed: No  Procedures   Critical Care performed: No ____________________________________________   INITIAL IMPRESSION / ASSESSMENT AND PLAN / ED COURSE  Pertinent labs & imaging results that were available during my care of the patient were  reviewed by me and considered in my medical decision making (see chart for details).   Patient overall well-appearing and in no acute distress, symptoms consistent with COVID-19.  Vitals and exam are reassuring, recommend supportive care, outpatient follow-up, return precautions discussed   ____________________________________________   FINAL CLINICAL IMPRESSION(S) / ED DIAGNOSES  Final diagnoses:  Suspected COVID-19 virus infection      NEW MEDICATIONS STARTED DURING THIS VISIT:  Discharge Medication List as of 08/28/2020  9:23 AM       Note:  This document was prepared using Dragon voice recognition software and may include unintentional dictation errors.    Jene Every, MD 09/02/20 1321

## 2020-09-29 ENCOUNTER — Other Ambulatory Visit: Payer: Self-pay | Admitting: Physician Assistant

## 2020-09-29 DIAGNOSIS — Z3041 Encounter for surveillance of contraceptive pills: Secondary | ICD-10-CM

## 2020-09-29 NOTE — Telephone Encounter (Signed)
Per chart review, last RP was 12/2017 and seen 05/2019, 07/2019, and 10/2019.  Will OK refill one time only and patient will need RP visit prior to further refills.  Note on refill for pharmacy to notify patient.

## 2020-12-17 ENCOUNTER — Other Ambulatory Visit: Payer: Self-pay

## 2020-12-17 ENCOUNTER — Ambulatory Visit (LOCAL_COMMUNITY_HEALTH_CENTER): Payer: BC Managed Care – PPO | Admitting: Physician Assistant

## 2020-12-17 ENCOUNTER — Encounter: Payer: Self-pay | Admitting: Physician Assistant

## 2020-12-17 VITALS — BP 127/86 | Ht 64.0 in | Wt 198.0 lb

## 2020-12-17 DIAGNOSIS — Z3009 Encounter for other general counseling and advice on contraception: Secondary | ICD-10-CM

## 2020-12-17 DIAGNOSIS — Z3041 Encounter for surveillance of contraceptive pills: Secondary | ICD-10-CM

## 2020-12-17 DIAGNOSIS — Z0000 Encounter for general adult medical examination without abnormal findings: Secondary | ICD-10-CM | POA: Diagnosis not present

## 2020-12-17 MED ORDER — NORGESTIMATE-ETH ESTRADIOL 0.25-35 MG-MCG PO TABS: 1.0000 | ORAL_TABLET | Freq: Every day | ORAL | 3 refills | Status: DC

## 2020-12-18 ENCOUNTER — Encounter: Payer: Self-pay | Admitting: Physician Assistant

## 2020-12-18 NOTE — Progress Notes (Signed)
[facility] DEPARTMENT [facility] [address] Main Number: [phone]    Family Planning Visit- Initial Visit  Subjective:  Melissa Mercer is a 25 y.o.  G2P2   being seen today for an initial annual visit and to discuss contraceptive options.  The patient is currently using Combination OCPs for pregnancy prevention. Patient reports she does not want a pregnancy in the next year.  Patient has the following medical conditions has Low birth weight infant and Obesity, unspecified BMI=33.6 on their problem list.  Chief Complaint  Patient presents with  . Contraception    Annual exam and BCM    Patient reports that she has been doing well with continuous dosing of the OCs.  States that she ran out of her pills last week and started bleeding within 2-3 days of running out.  Per chart review, CBE due today and pap is due in 2023.  Patient with questions about who she would see about getting pregnant when she is ready due to her history of very irregular and heavy bleeding when not using BCM,   Patient denies any concerns.   Body mass index is 33.99 kg/m. - Patient is eligible for diabetes screening based on BMI and age >31?  not applicable HA1C ordered? not applicable  Patient reports 1  partner/s in last year. Desires STI screening?  No - patient declines  Has patient been screened once for HCV in the past?  No  No results found for: HCVAB  Does the patient have current drug use (including MJ), have a partner with drug use, and/or has been incarcerated since last result? No  If yes-- Screen for HCV through [facility]   Does the patient meet criteria for HBV testing? No  Criteria:  -Household, sexual or needle sharing contact with HBV -History of drug use -HIV positive -Those with known Hep C   Health Maintenance Due  Topic Date Due  . Hepatitis C Screening  Never done  . COVID-19 Vaccine (1) Never done  . PAP-Cervical Cytology  Screening  09/28/2018    Review of Systems  All other systems reviewed and are negative.   The following portions of the patient's history were reviewed and updated as appropriate: allergies, current medications, past family history, past medical history, past social history, past surgical history and problem list. Problem list updated.   See flowsheet for other program required questions.  Objective:   Vitals:   12/17/20 1019  BP: 127/86  Weight: 198 lb (89.8 kg)  Height: 5\' 4"  (1.626 m)    Physical Exam Vitals and nursing note reviewed.  Constitutional:      General: She is not in acute distress.    Appearance: Normal appearance.  HENT:     Head: Normocephalic and atraumatic.     Mouth/Throat:     Mouth: Mucous membranes are moist.     Pharynx: Oropharynx is clear. No oropharyngeal exudate or posterior oropharyngeal erythema.  Eyes:     Conjunctiva/sclera: Conjunctivae normal.  Neck:     Thyroid: No thyroid mass, thyromegaly or thyroid tenderness.  Cardiovascular:     Rate and Rhythm: Normal rate and regular rhythm.  Pulmonary:     Effort: Pulmonary effort is normal.     Breath sounds: Normal breath sounds.  Chest:  Breasts:     Right: Normal. No mass, nipple discharge, skin change, tenderness, axillary adenopathy or supraclavicular adenopathy.     Left: Normal. No mass, nipple discharge,  skin change, tenderness, axillary adenopathy or supraclavicular adenopathy.    Abdominal:     Palpations: Abdomen is soft. There is no mass.     Tenderness: There is no abdominal tenderness. There is no guarding or rebound.  Musculoskeletal:     Cervical back: Neck supple. No tenderness.  Lymphadenopathy:     Cervical: No cervical adenopathy.     Upper Body:     Right upper body: No supraclavicular, axillary or pectoral adenopathy.     Left upper body: No supraclavicular, axillary or pectoral adenopathy.  Skin:    General: Skin is warm and dry.     Findings: No bruising,  erythema, lesion or rash.  Neurological:     Mental Status: She is alert and oriented to person, place, and time.  Psychiatric:        Mood and Affect: Mood normal.        Behavior: Behavior normal.        Thought Content: Thought content normal.        Judgment: Judgment normal.       Assessment and Plan:  Melissa Mercer is a 24 y.o. female presenting to the [facility] Department for an initial annual wellness/contraceptive visit  Contraception counseling: Reviewed all forms of birth control options in the tiered based approach. available including abstinence; over the counter/barrier methods; hormonal contraceptive medication including pill, patch, ring, injection,contraceptive implant, ECP; hormonal and nonhormonal IUDs; permanent sterilization options including vasectomy and the various tubal sterilization modalities. Risks, benefits, and typical effectiveness rates were reviewed.  Questions were answered.  Written information was also given to the patient to review.  Patient desires to restart/continue with OCs continuous dosing, this was prescribed for patient. She will follow up in  1 year and prn for surveillance.  She was told to call with any further questions, or with any concerns about this method of contraception.  Emphasized use of condoms 100% of the time for STI prevention.  Patient was not a candidate for ECP today.   1. Encounter for counseling regarding contraception Reviewed with patient normal SE of OCs and when to call clinic with concerns. Enc condoms with all sex for STD protection.   2. Well woman exam (no gynecological exam) Reviewed with patient healthy habits to maintain general health. Enc MVI 1 po daily. Enc to establish with/ follow up with PCP for primary care concerns, age appropriate screenings and illness.  3. Surveillance of previously prescribed contraceptive pill Rx for Sprintec sent to patient's pharmacy of choice for using Sprintec  continuously. - norgestimate-ethinyl estradiol (SPRINTEC 28) 0.25-35 MG-MCG tablet; Take 1 tablet by mouth daily. Patient to take active pills for 12 weeks at a time for continuous dosing.  Dispense: 112 tablet; Refill: 3     Return in about 1 year (around 12/17/2021) for RP and prn.  No future appointments.  Matt Holmes, PA

## 2021-03-28 ENCOUNTER — Emergency Department: Payer: BC Managed Care – PPO

## 2021-03-28 ENCOUNTER — Encounter: Payer: Self-pay | Admitting: Emergency Medicine

## 2021-03-28 ENCOUNTER — Other Ambulatory Visit: Payer: Self-pay

## 2021-03-28 ENCOUNTER — Emergency Department
Admission: EM | Admit: 2021-03-28 | Discharge: 2021-03-28 | Disposition: A | Discharge status: Home / Self Care | Payer: BC Managed Care – PPO | Attending: Emergency Medicine | Admitting: Emergency Medicine

## 2021-03-28 DIAGNOSIS — U071 COVID-19: Secondary | ICD-10-CM | POA: Diagnosis not present

## 2021-03-28 DIAGNOSIS — Z28310 Unvaccinated for covid-19: Secondary | ICD-10-CM | POA: Diagnosis not present

## 2021-03-28 DIAGNOSIS — J069 Acute upper respiratory infection, unspecified: Secondary | ICD-10-CM | POA: Insufficient documentation

## 2021-03-28 DIAGNOSIS — R509 Fever, unspecified: Secondary | ICD-10-CM | POA: Diagnosis present

## 2021-03-28 DIAGNOSIS — Z20822 Contact with and (suspected) exposure to covid-19: Secondary | ICD-10-CM

## 2021-03-28 LAB — RESP PANEL BY RT-PCR (FLU A&B, COVID) ARPGX2
Influenza A by PCR: NEGATIVE
Influenza B by PCR: NEGATIVE
SARS Coronavirus 2 by RT PCR: POSITIVE — AB

## 2021-03-28 LAB — GROUP A STREP BY PCR: Group A Strep by PCR: NOT DETECTED

## 2021-03-28 MED ORDER — PROAIR RESPICLICK 108 (90 BASE) MCG/ACT IN AEPB: 2.0000 | INHALATION_SPRAY | Freq: Four times a day (QID) | RESPIRATORY_TRACT | 2 refills | Status: DC | PRN

## 2021-03-28 MED ORDER — AZITHROMYCIN 250 MG PO TABS: ORAL_TABLET | ORAL | 0 refills | Status: DC

## 2021-03-28 NOTE — ED Provider Notes (Signed)
[facility] Emergency Department Provider Note  ____________________________________________   Event Date/Time   First MD Initiated Contact with Patient 03/28/21 1407     (approximate)  I have reviewed the triage vital signs and the nursing notes.   HISTORY  Chief Complaint Cough (/) and Sore Throat    HPI Melissa Mercer is a 25 y.o. female presents to the emergency department with URI symptoms for 2 days.   Is complaining of cough, congestion, fever, chills, denies chest pain, shortness of breath suspects close contact with Covid19+ patient, patient is not vaccinated.  Patient states several coworkers have been positive recently   Past Medical History:  Diagnosis Date   Anemia    Blood transfusion without reported diagnosis     Patient Active Problem List   Diagnosis Date Noted   Low birth weight infant 06/20/2016   Obesity, unspecified BMI=33.6 09/29/2015    Past Surgical History:  Procedure Laterality Date   FEMUR CLOSED REDUCTION  2013    Prior to Admission medications   Medication Sig Start Date End Date Taking? Authorizing Provider  Albuterol Sulfate (PROAIR RESPICLICK) 108 (90 Base) MCG/ACT AEPB Inhale 2 puffs into the lungs every 6 (six) hours as needed. 03/28/21  Yes Dahna Hattabaugh, Roselyn Bering, PA-C  azithromycin (ZITHROMAX Z-PAK) 250 MG tablet 2 pills today then 1 pill a day for 4 days 03/28/21  Yes Katrin Grabel, Roselyn Bering, PA-C  norgestimate-ethinyl estradiol (ORTHO-CYCLEN) 0.25-35 MG-MCG tablet Take 1 tablet by mouth daily for 28 days. 10/23/19 11/20/19  Sciora, Austin Miles, CNM  norgestimate-ethinyl estradiol (SPRINTEC 28) 0.25-35 MG-MCG tablet Take 1 tablet by mouth daily. Patient to take active pills for 12 weeks at a time for continuous dosing. 12/17/20   Matt Holmes, PA    Allergies Amoxicillin-pot clavulanate and Amoxicillin  Family History  Problem Relation Age of Onset   Hypertension Mother    Diabetes Maternal Grandmother    Heart  disease Maternal Grandmother    Hypertension Maternal Grandmother     Social History Social History   Tobacco Use   Smoking status: Never   Smokeless tobacco: Never  Vaping Use   Vaping Use: Never used  Substance Use Topics   Alcohol use: No   Drug use: Not Currently    Types: Marijuana    Review of Systems  Constitutional: Positive fever/chills Eyes: No visual changes. ENT: Positive sore throat. Respiratory: Positive cough Cardiovascular: Denies chest pain Gastrointestinal: Denies abdominal pain Genitourinary: Negative for dysuria. Musculoskeletal: Negative for back pain. Skin: Negative for rash. Neurological: Denies neurological changes    ____________________________________________   PHYSICAL EXAM:  VITAL SIGNS: ED Triage Vitals  Enc Vitals Group     BP 03/28/21 1256 128/88     Pulse Rate 03/28/21 1256 (!) 102     Resp 03/28/21 1256 18     Temp 03/28/21 1256 98.4 F (36.9 C)     Temp Source 03/28/21 1256 Oral     SpO2 03/28/21 1256 99 %     Weight 03/28/21 1256 185 lb (83.9 kg)     Height 03/28/21 1405 5\' 4"  (1.626 m)     Head Circumference --      Peak Flow --      Pain Score --      Pain Loc --      Pain Edu? --      Excl. in [place]? --     Constitutional: Alert and oriented. Well appearing and in no acute distress. Eyes: Conjunctivae  are normal.  Head: Atraumatic. Nose: No congestion/rhinnorhea. Mouth/Throat: Mucous membranes are moist.   Neck:  supple no lymphadenopathy noted Cardiovascular: Normal rate, regular rhythm. Heart sounds are normal Respiratory: Normal respiratory effort.  No retractions, lungs CTA GU: deferred Musculoskeletal: FROM all extremities, warm and well perfused Neurologic:  Normal speech and language.  Skin:  Skin is warm, dry and intact. No rash noted. Psychiatric: Mood and affect are normal. Speech and behavior are normal.  ____________________________________________   LABS (all labs ordered are listed, but only  abnormal results are displayed)  Labs Reviewed  GROUP A STREP BY PCR  RESP PANEL BY RT-PCR (FLU A&B, COVID) [id number]   ____________________________________________   ____________________________________________  RADIOLOGY  Chest x-ray  ____________________________________________   PROCEDURES  Procedure(s) performed: No  Procedures    ____________________________________________   INITIAL IMPRESSION / ASSESSMENT AND PLAN / ED COURSE  Pertinent labs & imaging results that were available during my care of the patient were reviewed by me and considered in my medical decision making (see chart for details).   Patient is a 25 year old female who complains of URI symptoms.  Exam is consistent with covid.    Pending test for covid, strep test negative  Chest x-ray reviewed by me confirmed by radiology to be negative for acute abnormality  Test results were discussed with patient.  She states she feels just like when she had COVID in January.  We performed COVID test.  Told her the results will be back in 2 hours.  Patient does have [facility] MyChart so she can access her test results.  If negative she can use a Z-Pak.  Albuterol inhaler if any difficulty breathing.  Return emergency department worsening   The patient was instructed to quarantine themselves at home.  Follow-up with your regular doctor if any concerns.  Return emergency department for worsening. OTC measures discussed     [name] was evaluated in Emergency Department on 03/28/2021 for the symptoms described in the history of present illness. She was evaluated in the context of the global COVID-19 pandemic, which necessitated consideration that the patient might be at risk for infection with the SARS-CoV-2 virus that causes COVID-19. Institutional protocols and algorithms that pertain to the evaluation of patients at risk for COVID-19 are in a state of rapid change based on information released by  regulatory bodies including the CDC and federal and state organizations. These policies and algorithms were followed during the patient's care in the ED.   As part of my medical decision making, I reviewed the following data within the electronic MEDICAL RECORD NUMBER Nursing notes reviewed and incorporated, Labs reviewed , Old chart reviewed, Radiograph reviewed , Notes from prior ED visits, and [place] Controlled Substance Database  ____________________________________________   FINAL CLINICAL IMPRESSION(S) / ED DIAGNOSES  Final diagnoses:  Suspected COVID-19 virus infection  Acute URI      NEW MEDICATIONS STARTED DURING THIS VISIT:  New Prescriptions   ALBUTEROL SULFATE (PROAIR RESPICLICK) 108 (90 BASE) MCG/ACT AEPB    Inhale 2 puffs into the lungs every 6 (six) hours as needed.   AZITHROMYCIN (ZITHROMAX Z-PAK) 250 MG TABLET    2 pills today then 1 pill a day for 4 days     Note:  This document was prepared using Dragon voice recognition software and may include unintentional dictation errors.    Faythe Ghee, PA-C 03/28/21 1433    Minna Antis, MD 03/29/21 [phone]

## 2021-03-28 NOTE — Discharge Instructions (Addendum)

## 2021-03-28 NOTE — ED Notes (Signed)
Attempt to call for result, no answer and no voicemail.

## 2021-03-28 NOTE — ED Provider Notes (Signed)
Patient called requesting change of inhaler due to insurance. Original prescriber, Dalia Heading, gone for the evening.  I attempted to change the prescription but all other equivalent inhalers are also not on formulary and require prior authorization.  Nebulized solution is available and on formulary, however I am unsure whether the patient has a machine.  I attempted to call her cell phone and received no answer and no voicemail.   Melissa Pester, FNP 03/28/21 1638    Sharyn Creamer, MD 03/28/21 2038

## 2021-03-28 NOTE — ED Notes (Signed)
See triage note  Presents with sore throat for the past 3 days  Unsure of fever  But has had hot/cold flashes

## 2021-03-28 NOTE — ED Triage Notes (Signed)
Pt in with c/o sore throat that began 3 days ago, along with worsening cough and sob with exertion, chills. Sats 98% on RA, states cough w/green phlegm present. Possible recent covid exposures at work

## 2021-03-29 ENCOUNTER — Telehealth: Payer: Self-pay | Admitting: Physician Assistant

## 2021-03-29 MED ORDER — ALBUTEROL SULFATE HFA 108 (90 BASE) MCG/ACT IN AERS
2.0000 | INHALATION_SPRAY | Freq: Four times a day (QID) | RESPIRATORY_TRACT | 2 refills | Status: DC | PRN
Start: 2021-03-29 — End: 2021-11-26

## 2021-03-29 MED ORDER — METHYLPREDNISOLONE 4 MG PO TBPK: ORAL_TABLET | ORAL | 0 refills | Status: DC

## 2021-03-29 NOTE — Telephone Encounter (Cosign Needed)
The patient called and stated that her insurance would not pay for the inhaler that was sent to the pharmacy.  Sent in a second prescription for just a regular albuterol inhaler.  Her insurance shows only Nebules are paid for.  She was also given a prescription for Medrol Dosepak.  I instructed secretary to let me know if she wants to try the Nebules and I can write a prescription for a DME nebulizer machine.

## 2021-11-26 ENCOUNTER — Inpatient Hospital Stay
Admission: EM | Admit: 2021-11-26 | Discharge: 2021-11-29 | DRG: 872 | Disposition: A | Discharge status: Home / Self Care | Payer: BC Managed Care – PPO | Attending: Internal Medicine | Admitting: Internal Medicine

## 2021-11-26 ENCOUNTER — Other Ambulatory Visit: Payer: Self-pay

## 2021-11-26 ENCOUNTER — Encounter: Payer: Self-pay | Admitting: Emergency Medicine

## 2021-11-26 ENCOUNTER — Emergency Department: Payer: BC Managed Care – PPO

## 2021-11-26 DIAGNOSIS — Z880 Allergy status to penicillin: Secondary | ICD-10-CM

## 2021-11-26 DIAGNOSIS — N390 Urinary tract infection, site not specified: Secondary | ICD-10-CM | POA: Diagnosis not present

## 2021-11-26 DIAGNOSIS — E876 Hypokalemia: Secondary | ICD-10-CM | POA: Diagnosis present

## 2021-11-26 DIAGNOSIS — E669 Obesity, unspecified: Secondary | ICD-10-CM | POA: Diagnosis present

## 2021-11-26 DIAGNOSIS — M545 Low back pain, unspecified: Secondary | ICD-10-CM | POA: Diagnosis present

## 2021-11-26 DIAGNOSIS — R739 Hyperglycemia, unspecified: Secondary | ICD-10-CM | POA: Diagnosis present

## 2021-11-26 DIAGNOSIS — Z833 Family history of diabetes mellitus: Secondary | ICD-10-CM | POA: Diagnosis not present

## 2021-11-26 DIAGNOSIS — Z6830 Body mass index (BMI) 30.0-30.9, adult: Secondary | ICD-10-CM

## 2021-11-26 DIAGNOSIS — A419 Sepsis, unspecified organism: Secondary | ICD-10-CM | POA: Diagnosis not present

## 2021-11-26 DIAGNOSIS — R5381 Other malaise: Secondary | ICD-10-CM | POA: Diagnosis present

## 2021-11-26 DIAGNOSIS — N12 Tubulo-interstitial nephritis, not specified as acute or chronic: Secondary | ICD-10-CM | POA: Diagnosis present

## 2021-11-26 DIAGNOSIS — A4151 Sepsis due to Escherichia coli [E. coli]: Secondary | ICD-10-CM | POA: Diagnosis present

## 2021-11-26 DIAGNOSIS — Z8249 Family history of ischemic heart disease and other diseases of the circulatory system: Secondary | ICD-10-CM

## 2021-11-26 DIAGNOSIS — R509 Fever, unspecified: Secondary | ICD-10-CM | POA: Diagnosis not present

## 2021-11-26 LAB — BASIC METABOLIC PANEL
Anion gap: 10 (ref 5–15)
BUN: 10 mg/dL (ref 6–20)
CO2: 27 mmol/L (ref 22–32)
Calcium: 9.1 mg/dL (ref 8.9–10.3)
Chloride: 100 mmol/L (ref 98–111)
Creatinine, Ser: 0.72 mg/dL (ref 0.44–1.00)
GFR, Estimated: >60 mL/min (ref >60)
Glucose, Bld: 123 mg/dL — ABNORMAL HIGH (ref 70–99)
Potassium: 3.2 mmol/L — ABNORMAL LOW (ref 3.5–5.1)
Sodium: 137 mmol/L (ref 135–145)

## 2021-11-26 LAB — CBC
HCT: 40.6 % (ref 36.0–46.0)
Hemoglobin: 13.1 g/dL (ref 12.0–15.0)
MCH: 27.5 pg (ref 26.0–34.0)
MCHC: 32.3 g/dL (ref 30.0–36.0)
MCV: 85.3 fL (ref 80.0–100.0)
Platelets: 330 10*3/uL (ref 150–400)
RBC: 4.76 MIL/uL (ref 3.87–5.11)
RDW: 11.7 % (ref 11.5–15.5)
WBC: 13.4 10*3/uL — ABNORMAL HIGH (ref 4.0–10.5)
nRBC: 0 % (ref 0.0–0.2)

## 2021-11-26 LAB — URINALYSIS, ROUTINE W REFLEX MICROSCOPIC
Bilirubin Urine: NEGATIVE
Glucose, UA: NEGATIVE mg/dL
Ketones, ur: NEGATIVE mg/dL
Nitrite: NEGATIVE
Protein, ur: 30 mg/dL — AB
Specific Gravity, Urine: 1.02 (ref 1.005–1.030)
WBC, UA: >50 WBC/hpf — ABNORMAL HIGH (ref 0–5)
pH: 6 (ref 5.0–8.0)

## 2021-11-26 LAB — LACTIC ACID, PLASMA: Lactic Acid, Venous: 1.3 mmol/L (ref 0.5–1.9)

## 2021-11-26 LAB — HCG, QUANTITATIVE, PREGNANCY: hCG, Beta Chain, Quant, S: 2 m[IU]/mL (ref <5)

## 2021-11-26 LAB — POC URINE PREG, ED: Preg Test, Ur: NEGATIVE

## 2021-11-26 LAB — PREGNANCY, URINE: Preg Test, Ur: NEGATIVE

## 2021-11-26 MED ORDER — ONDANSETRON HCL 4 MG/2ML IJ SOLN: 4.0000 mg | Freq: Four times a day (QID) | INTRAMUSCULAR | Status: DC | PRN

## 2021-11-26 MED ORDER — SODIUM CHLORIDE 0.9 % IV SOLN
2.0000 g | INTRAVENOUS | Status: DC
  Administered 2021-11-27 – 2021-11-28 (×2): 2 g via INTRAVENOUS
  Filled 2021-11-26: qty 20
  Filled 2021-11-26 (×2): qty 2

## 2021-11-26 MED ORDER — SODIUM CHLORIDE 0.9 % IV SOLN
1.0000 g | Freq: Once | INTRAVENOUS | Status: AC
  Administered 2021-11-26: 1 g via INTRAVENOUS
  Filled 2021-11-26: qty 10

## 2021-11-26 MED ORDER — OXYCODONE HCL 5 MG PO TABS
5.0000 mg | ORAL_TABLET | ORAL | Status: DC | PRN
  Administered 2021-11-27 – 2021-11-29 (×10): 5 mg via ORAL
  Filled 2021-11-26 (×10): qty 1

## 2021-11-26 MED ORDER — ENOXAPARIN SODIUM 40 MG/0.4ML IJ SOSY: 40.0000 mg | PREFILLED_SYRINGE | INTRAMUSCULAR | Status: DC

## 2021-11-26 MED ORDER — IOHEXOL 300 MG/ML  SOLN
100.0000 mL | Freq: Once | INTRAMUSCULAR | Status: AC | PRN
  Administered 2021-11-26: 100 mL via INTRAVENOUS
  Filled 2021-11-26: qty 100

## 2021-11-26 MED ORDER — POTASSIUM CHLORIDE CRYS ER 20 MEQ PO TBCR
40.0000 meq | EXTENDED_RELEASE_TABLET | Freq: Two times a day (BID) | ORAL | Status: AC
  Administered 2021-11-26 – 2021-11-27 (×2): 40 meq via ORAL
  Filled 2021-11-26 (×2): qty 2

## 2021-11-26 MED ORDER — ACETAMINOPHEN 325 MG PO TABS
650.0000 mg | ORAL_TABLET | Freq: Four times a day (QID) | ORAL | Status: DC | PRN
  Administered 2021-11-27 – 2021-11-28 (×4): 650 mg via ORAL
  Filled 2021-11-26 (×4): qty 2

## 2021-11-26 MED ORDER — POTASSIUM CHLORIDE CRYS ER 20 MEQ PO TBCR
40.0000 meq | EXTENDED_RELEASE_TABLET | Freq: Once | ORAL | Status: AC
  Administered 2021-11-26: 40 meq via ORAL
  Filled 2021-11-26: qty 2

## 2021-11-26 MED ORDER — KETOROLAC TROMETHAMINE 15 MG/ML IJ SOLN
15.0000 mg | Freq: Once | INTRAMUSCULAR | Status: AC
  Administered 2021-11-26: 15 mg via INTRAVENOUS
  Filled 2021-11-26: qty 1

## 2021-11-26 MED ORDER — ACETAMINOPHEN 500 MG PO TABS
1000.0000 mg | ORAL_TABLET | Freq: Once | ORAL | Status: AC
  Administered 2021-11-26: 1000 mg via ORAL
  Filled 2021-11-26: qty 2

## 2021-11-26 MED ORDER — MELATONIN 5 MG PO TABS
5.0000 mg | ORAL_TABLET | Freq: Every evening | ORAL | Status: DC | PRN
  Administered 2021-11-27 – 2021-11-28 (×2): 5 mg via ORAL
  Filled 2021-11-26 (×2): qty 1

## 2021-11-26 MED ORDER — SODIUM CHLORIDE 0.9 % IV BOLUS
1000.0000 mL | Freq: Once | INTRAVENOUS | Status: AC
  Administered 2021-11-26: 1000 mL via INTRAVENOUS

## 2021-11-26 MED ORDER — LACTATED RINGERS IV SOLN
INTRAVENOUS | Status: DC
Start: 2021-11-26 — End: 2021-11-28

## 2021-11-26 MED ORDER — POLYETHYLENE GLYCOL 3350 17 G PO PACK: 17.0000 g | PACK | Freq: Every day | ORAL | Status: DC | PRN

## 2021-11-26 MED ORDER — SENNOSIDES-DOCUSATE SODIUM 8.6-50 MG PO TABS
1.0000 | ORAL_TABLET | Freq: Every day | ORAL | Status: DC
  Administered 2021-11-26 – 2021-11-28 (×3): 1 via ORAL
  Filled 2021-11-26 (×3): qty 1

## 2021-11-26 NOTE — ED Triage Notes (Signed)
First Nurse Note:  Arrives c/o fever, low back pian, body aches x 1 week. ? ?AAOx3.  Skin warm and dry. NAD ?

## 2021-11-26 NOTE — ED Triage Notes (Signed)
Pt in via POV, reports ongoing fever, dysuria, urinary frequency and lower back pain x approximately one week.  Ambulatory to triage, febrile upon arrival.  NAD noted at this time.   ?

## 2021-11-26 NOTE — ED Provider Notes (Signed)
? ?[facility] ?Provider Note ? ? ? Event Date/Time  ? First MD Initiated Contact with Patient 11/26/21 1818   ?  (approximate) ? ? ?History  ? ?Fever and Urinary Frequency ? ? ?HPI ? ?Melissa Mercer is a 26 y.o. female with past medical history of anemia who presents with fever urinary frequency and back pain.  Symptoms have been going on for about a week and a half.  Patient notes that intermittently she will have dysuria but then it goes away.  Has been taking Azo over-the-counter.  Also has urinary frequency.  Also complains of low back pain that is sometimes on both sides but often worse on the left.  Has had fevers at home for past 3 to 4 days.  Patient felt similarly when she was pregnant with her son.  Had 1 episode of emesis today.  Some diarrhea.  Also with some lower abdominal cramping.  Denies shortness of breath cough congestion. ?  ? ?Past Medical History:  ?Diagnosis Date  ? Anemia   ? Blood transfusion without reported diagnosis   ? ? ?Patient Active Problem List  ? Diagnosis Date Noted  ? Low birth weight infant 06/20/2016  ? Obesity, unspecified BMI=33.6 09/29/2015  ? ? ? ?Physical Exam  ?Triage Vital Signs: ?ED Triage Vitals  ?Enc Vitals Group  ?   BP 11/26/21 1757 129/81  ?   Pulse Rate 11/26/21 1757 (!) 120  ?   Resp 11/26/21 1757 18  ?   Temp 11/26/21 1757 (!) 102.7 ?F (39.3 ?C)  ?   Temp Source 11/26/21 1757 Oral  ?   SpO2 11/26/21 1757 100 %  ?   Weight 11/26/21 1802 178 lb (80.7 kg)  ?   Height 11/26/21 1802 5\' 4"  (1.626 m)  ?   Head Circumference --   ?   Peak Flow --   ?   Pain Score 11/26/21 1801 9  ?   Pain Loc --   ?   Pain Edu? --   ?   Excl. in [place]? --   ? ? ?Most recent vital signs: ?Vitals:  ? 11/26/21 1929 11/26/21 1947  ?BP:  (!) 124/92  ?Pulse: 98 (!) 107  ?Resp:  18  ?Temp:  (!) 102.6 ?F (39.2 ?C)  ?SpO2:  99%  ? ? ? ?General: Awake, no distress.  ?CV:  Good peripheral perfusion.  Tachycardic ?Resp:  Normal effort.  ?Abd:  No distention.  Abdomen is soft  and nontender, no CVA tenderness ?Neuro:             Awake, Alert, Oriented x 3  ?Other:   ? ? ?ED Results / Procedures / Treatments  ?Labs ?(all labs ordered are listed, but only abnormal results are displayed) ?Labs Reviewed  ?URINALYSIS, ROUTINE W REFLEX MICROSCOPIC - Abnormal; Notable for the following components:  ?    Result Value  ? Color, Urine YELLOW (*)   ? APPearance CLOUDY (*)   ? Hgb urine dipstick SMALL (*)   ? Protein, ur 30 (*)   ? Leukocytes,Ua LARGE (*)   ? WBC, UA >50 (*)   ? Bacteria, UA FEW (*)   ? All other components within normal limits  ?BASIC METABOLIC PANEL - Abnormal; Notable for the following components:  ? Potassium 3.2 (*)   ? Glucose, Bld 123 (*)   ? All other components within normal limits  ?CBC - Abnormal; Notable for the following components:  ? WBC 13.4 (*)   ?  All other components within normal limits  ?URINE CULTURE  ?CULTURE, BLOOD (ROUTINE X 2)  ?CULTURE, BLOOD (ROUTINE X 2)  ?LACTIC ACID, PLASMA  ?PREGNANCY, URINE  ?LACTIC ACID, PLASMA  ?HCG, QUANTITATIVE, PREGNANCY  ?POC URINE PREG, ED  ? ? ? ?EKG ? ? ? ? ?RADIOLOGY ? ?CT abdomen reviewed by myself shows right-sided pyelonephritis ? ?PROCEDURES: ? ?Critical Care performed: No ? ?.1-3 Lead EKG Interpretation ?Performed by: Rada Hay, MD ?Authorized by: Rada Hay, MD  ? ?  Interpretation: abnormal   ?  ECG rate assessment: tachycardic   ?  Rhythm: sinus rhythm   ?  Ectopy: none   ?  Conduction: normal   ? ?The patient is on the cardiac monitor to evaluate for evidence of arrhythmia and/or significant heart rate changes. ? ? ?MEDICATIONS ORDERED IN ED: ?Medications  ?sodium chloride 0.9 % bolus 1,000 mL (has no administration in time range)  ?sodium chloride 0.9 % bolus 1,000 mL (1,000 mLs Intravenous New Bag/Given 11/26/21 1846)  ?acetaminophen (TYLENOL) tablet 1,000 mg (1,000 mg Oral Given 11/26/21 1844)  ?ketorolac (TORADOL) 15 MG/ML injection 15 mg (15 mg Intravenous Given 11/26/21 1912)  ?cefTRIAXone  (ROCEPHIN) 1 g in sodium chloride 0.9 % 100 mL IVPB (0 g Intravenous Stopped 11/26/21 1952)  ?potassium chloride SA (KLOR-CON M) CR tablet 40 mEq (40 mEq Oral Given 11/26/21 1950)  ?iohexol (OMNIPAQUE) 300 MG/ML solution 100 mL (100 mLs Intravenous Contrast Given 11/26/21 1934)  ? ? ? ?IMPRESSION / MDM / ASSESSMENT AND PLAN / ED COURSE  ?I reviewed the triage vital signs and the nursing notes. ?             ?               ? ?Differential diagnosis includes, but is not limited to, pyelonephritis, viral illness, enteritis/colitis ? ?Is a 26 year old female presents with fever urinary frequency dysuria and back pain.  She is febrile to almost 103 tachycardic to 120s but normal blood pressure.  Appears well on exam she is nontoxic.  Abdomen is soft and nontender she really has no CVA tenderness although does have some left lower back pain.  Symptoms of been going on for about a week and a half.  Urinary symptoms somewhat intermittent in the urinary frequency is the most significant complaint.  Does have mild headache but exam is not consistent with meningitis.  She has some mild lower abdominal pain but abdomen is benign my suspicion for acute appendicitis or other acute abdominal process is overall low.  Does not have any symptoms of pneumonia.  Will check labs including CBC BMP and lactate give Tylenol and fluids and UA.  I suspect pyelonephritis. ? ?Patient's UA is consistent with infection.  She has a mild leukocytosis to 13.4.  BMP also reassuring, she is mildly hypokalemic with a potassium of 3.2.  Pregnancy test is negative.  We will give a dose of Rocephin and obtain a CT abdomen pelvis to further assess for complication of pyelonephritis.  She is receiving fluids heart rate is improving. ? ?The time of signout she is imaging and reassessment as well as lactate.  If patient's vital signs normalized and she is feeling well tolerating p.o. may be able to be discharged.  However if lactate significantly elevated or  patient not tolerating p.o. would favor admission. ? ?Lactate is negative.  CT confirms right-sided pyelonephritis.  On recheck vitals are still notable for tachycardia and fever to 102.6.  I recommended  admission for another dose of IV antibiotics and observation patient was okay with this.  Discussed with hospice for admission ? ?Clinical Course as of 11/26/21 2000  ?Fri Nov 26, 2021  ?1849 WBC(!): 13.4 [KM]  ?1904 Preg Test, Ur: NEGATIVE [KM]  ?1934 Lactic Acid, Venous: 1.3 [KM]  ?  ?Clinical Course User Index ?[KM] Rada Hay, MD  ? ? ? ?FINAL CLINICAL IMPRESSION(S) / ED DIAGNOSES  ? ?Final diagnoses:  ?Pyelonephritis  ? ? ? ?Rx / DC Orders  ? ?ED Discharge Orders   ? ? None  ? ?  ? ? ? ?Note:  This document was prepared using Dragon voice recognition software and may include unintentional dictation errors. ?  ?Rada Hay, MD ?11/26/21 2000 ? ?

## 2021-11-26 NOTE — ED Notes (Signed)
Calling partner to discuss possible admission.  ?

## 2021-11-26 NOTE — H&P (Signed)
?History and Physical ? ?Melissa Mercer QQV:[id number] DOB: Jul 02, 1996 DOA: 11/26/2021 ? ?Referring physician: Dr. Sidney Ace, EDP  ?PCP: Patient, No Pcp Per (Inactive)  ?Outpatient Specialists: None ?Patient coming from: Home ? ?Chief Complaint: Fever, bilateral flank pain and increased urinary frequency. ? ?HPI: Melissa Mercer is a 26 y.o. female with no significant past medical history who presented to [facility] ED from home with complaints of a week and half of intermittent fevers, bilateral flank pain.  Associated with increased urinary frequency, lower abdominal pain, nausea and vomiting x1 today.  Upon presentation to the ED, the patient was found to be febrile with Tmax of 102.6, leukocytosis, tachycardia, urine analysis positive for pyuria, CT findings of right pyelonephritis.  Blood cultures and urine culture were sent.  Patient received 2 L of IV fluid boluses, and was started on IV antibiotics empirically, Rocephin.  EDP called to admit by hospitalist service, [facility]. ? ?ED Course: Tmax 102.7.  BP 115/71, pulse 107, respiration rate 18, O2 saturation 99% on room air.  Lab studies remarkable for serum potassium 3.2, glucose 123, WBC 13.4, UA positive for pyuria. ? ?Review of Systems: ?Review of systems as noted in the HPI. All other systems reviewed and are negative. ? ? ?Past Medical History:  ?Diagnosis Date  ? Anemia   ? Blood transfusion without reported diagnosis   ? ?Past Surgical History:  ?Procedure Laterality Date  ? FEMUR CLOSED REDUCTION  2013  ? ? ?Social History:  reports that she has never smoked. She has never used smokeless tobacco. She reports current drug use. Drug: Marijuana. She reports that she does not drink alcohol. ? ? ?Allergies  ?Allergen Reactions  ? Amoxicillin-Pot Clavulanate Hives  ? Amoxicillin Nausea And Vomiting  ?  Other reaction(s): nausea vomiting  ? ? ?Family History  ?Problem Relation Age of Onset  ? Hypertension Mother   ? Diabetes Maternal Grandmother   ? Heart disease  Maternal Grandmother   ? Hypertension Maternal Grandmother   ?  ? ? ?Prior to Admission medications   ?Medication Sig Start Date End Date Taking? Authorizing Provider  ?albuterol (VENTOLIN HFA) 108 (90 Base) MCG/ACT inhaler Inhale 2 puffs into the lungs every 6 (six) hours as needed for wheezing or shortness of breath. 03/29/21   Faythe Ghee, PA-C  ?azithromycin (ZITHROMAX Z-PAK) 250 MG tablet 2 pills today then 1 pill a day for 4 days 03/28/21   Faythe Ghee, PA-C  ?methylPREDNISolone (MEDROL DOSEPAK) 4 MG TBPK tablet Take 6 pills on day one then decrease by 1 pill each day 03/29/21   Faythe Ghee, PA-C  ?norgestimate-ethinyl estradiol (ORTHO-CYCLEN) 0.25-35 MG-MCG tablet Take 1 tablet by mouth daily for 28 days. 10/23/19 11/20/19  Alberteen Spindle, CNM  ?norgestimate-ethinyl estradiol (SPRINTEC 28) 0.25-35 MG-MCG tablet Take 1 tablet by mouth daily. Patient to take active pills for 12 weeks at a time for continuous dosing. 12/17/20   Matt Holmes, PA  ? ? ?Physical Exam: ?BP (!) 124/92 (BP Location: Right Arm)   Pulse (!) 107   Temp (!) 102.6 ?F (39.2 ?C) (Oral)   Resp 18   Ht 5\' 4"  (1.626 m)   Wt 80.7 kg   LMP 09/13/2021 (Exact Date)   SpO2 99%   BMI 30.55 kg/m?  ? ?General: 26 y.o. year-old female well developed well nourished in no acute distress.  Alert and oriented x3. ?Cardiovascular: Regular rate and rhythm with no rubs or gallops.  No thyromegaly or JVD noted.  No  lower extremity edema. 2/4 pulses in all 4 extremities. ?Respiratory: Clear to auscultation with no wheezes or rales. Good inspiratory effort. ?Abdomen: Soft bilateral flank tenderness with palpation.  Nondistended with normal bowel sounds x4 quadrants. ?Muskuloskeletal: No cyanosis, clubbing or edema noted bilaterally ?Neuro: CN II-XII intact, strength, sensation, reflexes ?Skin: No ulcerative lesions noted or rashes ?Psychiatry: Judgement and insight appear normal. Mood is appropriate for condition and setting ?   ?   ?   ?Labs  on Admission:  ?Basic Metabolic Panel: ?Recent Labs  ?Lab 11/26/21 ?1825  ?NA 137  ?K 3.2*  ?CL 100  ?CO2 27  ?GLUCOSE 123*  ?BUN 10  ?CREATININE 0.72  ?CALCIUM 9.1  ? ?Liver Function Tests: ?No results for input(s): AST, ALT, ALKPHOS, BILITOT, PROT, ALBUMIN in the last 168 hours. ?No results for input(s): LIPASE, AMYLASE in the last 168 hours. ?No results for input(s): AMMONIA in the last 168 hours. ?CBC: ?Recent Labs  ?Lab 11/26/21 ?1825  ?WBC 13.4*  ?HGB 13.1  ?HCT 40.6  ?MCV 85.3  ?PLT 330  ? ?Cardiac Enzymes: ?No results for input(s): CKTOTAL, CKMB, CKMBINDEX, TROPONINI in the last 168 hours. ? ?BNP (last 3 results) ?No results for input(s): BNP in the last 8760 hours. ? ?ProBNP (last 3 results) ?No results for input(s): PROBNP in the last 8760 hours. ? ?CBG: ?No results for input(s): GLUCAP in the last 168 hours. ? ?Radiological Exams on Admission: ?CT ABDOMEN PELVIS W CONTRAST ? ?Result Date: 11/26/2021 ?CLINICAL DATA:  Low back pain and fevers for 1 week EXAM: CT ABDOMEN AND PELVIS WITH CONTRAST TECHNIQUE: Multidetector CT imaging of the abdomen and pelvis was performed using the standard protocol following bolus administration of intravenous contrast. RADIATION DOSE REDUCTION: This exam was performed according to the departmental dose-optimization program which includes automated exposure control, adjustment of the mA and/or kV according to patient size and/or use of iterative reconstruction technique. CONTRAST:  OMNIPAQUE IOHEXOL 300 MG/ML  SOLN COMPARISON:  None. FINDINGS: Lower chest: No acute abnormality. Hepatobiliary: No focal liver abnormality is seen. No gallstones, gallbladder wall thickening, or biliary dilatation. Pancreas: Unremarkable. No pancreatic ductal dilatation or surrounding inflammatory changes. Spleen: Normal in size without focal abnormality. Adrenals/Urinary Tract: Adrenal glands are within normal limits. Kidneys are well visualized bilaterally within normal appearing enhancement  pattern with the exception of the posterior aspect of the right kidney where patchy decreased enhancement is noted. This likely represents an area of focal pyelonephritis. No associated obstructive changes are seen. No renal or ureteral calculi are noted. The bladder is partially distended. Stomach/Bowel: Colon is predominately decompressed. Stomach is distended with ingested food stuffs. Fluid is noted throughout the small bowel without obstructive changes. The appendix is within normal limits. Vascular/Lymphatic: No significant vascular findings are present. No enlarged abdominal or pelvic lymph nodes. Reproductive: Uterus and bilateral adnexa are unremarkable. Other: No abdominal wall hernia or abnormality. No abdominopelvic ascites. Musculoskeletal: Postsurgical changes in the proximal right hip are noted. No acute bony abnormality is seen. IMPRESSION: Patchy area of decreased enhancement identified in the posterior aspect of the right kidney best visualized on image number 31 of series 2. This is most consistent with focal pyelonephritis. No other focal abnormality is noted. Electronically Signed   By: Alcide Clever M.D.   On: 11/26/2021 19:45   ? ?EKG: I independently viewed the EKG done and my findings are as followed: None available at the time of this dictation. ? ?Assessment/Plan ?Present on Admission: ? Pyelonephritis of right kidney ? ?  Principal Problem: ?  Pyelonephritis of right kidney ? ?Sepsis secondary to right-sided pyelonephritis, POA ?Presented with fever with Tmax of 102.7, leukocytosis 13.4, tachycardia 107, UA positive for pyuria, CT scan with evidence of right pyonephritis ?1 and 1/2-week history of intermittent fevers, bilateral flank pain, increasing urinary frequency, nausea and vomiting x1 ?Received 2 L normal saline boluses in the ED ?Started on Rocephin empirically, continue ?Monitor urine culture, blood cultures for ID and sensitivities ?Narrow down antibiotics according to cultures  results ?Monitor fever curve and WBC ?Pain control as well as bowel regimen in place ?IV fluid, LR at 50 cc/h x 2 days. ? ?Hypokalemia ?Serum potassium 3.2 ?Repleted orally ? ?Mild hyperglycemia ?No prior history of di

## 2021-11-27 ENCOUNTER — Encounter: Payer: Self-pay | Admitting: Internal Medicine

## 2021-11-27 DIAGNOSIS — E669 Obesity, unspecified: Secondary | ICD-10-CM

## 2021-11-27 DIAGNOSIS — A419 Sepsis, unspecified organism: Secondary | ICD-10-CM

## 2021-11-27 DIAGNOSIS — N12 Tubulo-interstitial nephritis, not specified as acute or chronic: Secondary | ICD-10-CM | POA: Diagnosis not present

## 2021-11-27 LAB — COMPREHENSIVE METABOLIC PANEL
ALT: 35 U/L (ref 0–44)
AST: 36 U/L (ref 15–41)
Albumin: 3.3 g/dL — ABNORMAL LOW (ref 3.5–5.0)
Alkaline Phosphatase: 61 U/L (ref 38–126)
Anion gap: 6 (ref 5–15)
BUN: 9 mg/dL (ref 6–20)
CO2: 27 mmol/L (ref 22–32)
Calcium: 8.6 mg/dL — ABNORMAL LOW (ref 8.9–10.3)
Chloride: 104 mmol/L (ref 98–111)
Creatinine, Ser: 0.73 mg/dL (ref 0.44–1.00)
GFR, Estimated: >60 mL/min (ref >60)
Glucose, Bld: 107 mg/dL — ABNORMAL HIGH (ref 70–99)
Potassium: 4.1 mmol/L (ref 3.5–5.1)
Sodium: 137 mmol/L (ref 135–145)
Total Bilirubin: 0.7 mg/dL (ref 0.3–1.2)
Total Protein: 7 g/dL (ref 6.5–8.1)

## 2021-11-27 LAB — CBC
HCT: 36.9 % (ref 36.0–46.0)
Hemoglobin: 12.1 g/dL (ref 12.0–15.0)
MCH: 28.3 pg (ref 26.0–34.0)
MCHC: 32.8 g/dL (ref 30.0–36.0)
MCV: 86.4 fL (ref 80.0–100.0)
Platelets: 303 10*3/uL (ref 150–400)
RBC: 4.27 MIL/uL (ref 3.87–5.11)
RDW: 11.6 % (ref 11.5–15.5)
WBC: 12.2 10*3/uL — ABNORMAL HIGH (ref 4.0–10.5)
nRBC: 0 % (ref 0.0–0.2)

## 2021-11-27 LAB — HIV ANTIBODY (ROUTINE TESTING W REFLEX): HIV Screen 4th Generation wRfx: NONREACTIVE

## 2021-11-27 LAB — PHOSPHORUS: Phosphorus: 2.3 mg/dL — ABNORMAL LOW (ref 2.5–4.6)

## 2021-11-27 LAB — GLUCOSE, CAPILLARY
Glucose-Capillary: 85 mg/dL (ref 70–99)
Glucose-Capillary: 107 mg/dL — ABNORMAL HIGH (ref 70–99)

## 2021-11-27 LAB — MAGNESIUM: Magnesium: 2.2 mg/dL (ref 1.7–2.4)

## 2021-11-27 MED ORDER — IBUPROFEN 400 MG PO TABS: 600.0000 mg | ORAL_TABLET | Freq: Once | ORAL | Status: DC

## 2021-11-27 NOTE — Progress Notes (Signed)
?PROGRESS NOTE ? ? ? ?Melissa Mercer  IWL:[id number] DOB: 04-26-1996 DOA: 11/26/2021 ?PCP: Patient, No Pcp Per (Inactive)  ? ? ?Assessment & Plan: ?  ?Principal Problem: ?  Pyelonephritis of right kidney ? ? ?Sepsis: met criteria w/ fever, leukocytosis, tachycardia & likely pyelonephritis. Continue on IV rocephin. Blood cxs NGTD & urine cx is pending. Continue on IVFs ? ?Pyelonephritis: on the right as per CT. Continue on IV rocephin. Urine cx is pending  ?  ?Hypokalemia: WNL today  ?  ?Hyperglycemia: no hx of DM. Will continue to monitor  ?  ?Obesity: BMI 30.8. Would benefit from weight loss  ? ?  ? ? ?DVT prophylaxis: lovenox  ?Code Status: full  ?Family Communication:  ?Disposition Plan: likely d/c back home  ? ?Level of care: Telemetry Medical ? ?Status is: Inpatient ?Remains inpatient appropriate because: requiring IV abxs ? ? ? ?Consultants:  ? ? ?Procedures:  ? ?Antimicrobials: rocephin  ? ? ?Subjective: ?Pt c/o flank pain  ? ?Objective: ?Vitals:  ? 11/27/21 0236 11/27/21 1740 11/27/21 8144 11/27/21 0737  ?BP: 128/61 (!) 107/56 100/67 104/74  ?Pulse: (!) 102 96 88 94  ?Resp: '16 16 16 17  ' ?Temp: (!) 101.9 ?F (38.8 ?C) 99.7 ?F (37.6 ?C) 98.5 ?F (36.9 ?C) 99.2 ?F (37.3 ?C)  ?TempSrc: Oral Oral Oral   ?SpO2: 100% 100% 100% 100%  ?Weight:      ?Height:      ? ? ?Intake/Output Summary (Last 24 hours) at 11/27/2021 0740 ?Last data filed at 11/27/2021 0731 ?Gross per 24 hour  ?Intake 1989.72 ml  ?Output --  ?Net 1989.72 ml  ? ?Filed Weights  ? 11/26/21 1802 11/26/21 2102  ?Weight: 80.7 kg 81.4 kg  ? ? ?Examination: ? ?General exam: Appears calm and comfortable  ?Respiratory system: Clear to auscultation. Respiratory effort normal. ?Cardiovascular system: S1 & S2+. No rubs, gallops or clicks.  ?Gastrointestinal system: Abdomen is nondistended, soft and nontender.Normal bowel sounds heard. ?Central nervous system: Alert and oriented. Moves all extremities  ?Psychiatry: Judgement and insight appear normal. Mood & affect  appropriate.  ? ? ? ?Data Reviewed: I have personally reviewed following labs and imaging studies ? ?CBC: ?Recent Labs  ?Lab 11/26/21 ?1825 11/27/21 ?0448  ?WBC 13.4* 12.2*  ?HGB 13.1 12.1  ?HCT 40.6 36.9  ?MCV 85.3 86.4  ?PLT 330 303  ? ?Basic Metabolic Panel: ?Recent Labs  ?Lab 11/26/21 ?1825 11/27/21 ?0448  ?NA 137 137  ?K 3.2* 4.1  ?CL 100 104  ?CO2 27 27  ?GLUCOSE 123* 107*  ?BUN 10 9  ?CREATININE 0.72 0.73  ?CALCIUM 9.1 8.6*  ?MG  --  2.2  ?PHOS  --  2.3*  ? ?GFR: ?Estimated Creatinine Clearance: 108.1 mL/min (by C-G formula based on SCr of 0.73 mg/dL). ?Liver Function Tests: ?Recent Labs  ?Lab 11/27/21 ?0448  ?AST 36  ?ALT 35  ?ALKPHOS 61  ?BILITOT 0.7  ?PROT 7.0  ?ALBUMIN 3.3*  ? ?No results for input(s): LIPASE, AMYLASE in the last 168 hours. ?No results for input(s): AMMONIA in the last 168 hours. ?Coagulation Profile: ?No results for input(s): INR, PROTIME in the last 168 hours. ?Cardiac Enzymes: ?No results for input(s): CKTOTAL, CKMB, CKMBINDEX, TROPONINI in the last 168 hours. ?BNP (last 3 results) ?No results for input(s): PROBNP in the last 8760 hours. ?HbA1C: ?No results for input(s): HGBA1C in the last 72 hours. ?CBG: ?Recent Labs  ?Lab 11/27/21 ?8185  ?GLUCAP 85  ? ?Lipid Profile: ?No results for input(s): CHOL, HDL, LDLCALC,  TRIG, CHOLHDL, LDLDIRECT in the last 72 hours. ?Thyroid Function Tests: ?No results for input(s): TSH, T4TOTAL, FREET4, T3FREE, THYROIDAB in the last 72 hours. ?Anemia Panel: ?No results for input(s): VITAMINB12, FOLATE, FERRITIN, TIBC, IRON, RETICCTPCT in the last 72 hours. ?Sepsis Labs: ?Recent Labs  ?Lab 11/26/21 ?1825  ?LATICACIDVEN 1.3  ? ? ?Recent Results (from the past 240 hour(s))  ?Blood culture (routine x 2)     Status: None (Preliminary result)  ? Collection Time: 11/26/21  8:03 PM  ? Specimen: BLOOD  ?Result Value Ref Range Status  ? Specimen Description BLOOD LEFT ANTECUBITAL  Final  ? Special Requests   Final  ?  BOTTLES DRAWN AEROBIC AND ANAEROBIC Blood Culture  results may not be optimal due to an excessive volume of blood received in culture bottles  ? Culture   Final  ?  NO GROWTH < 12 HOURS ?Performed at [facility], [address] [place], [place] [address] ?  ? Report Status PENDING  Incomplete  ?Blood culture (routine x 2)     Status: None (Preliminary result)  ? Collection Time: 11/26/21  8:03 PM  ? Specimen: BLOOD  ?Result Value Ref Range Status  ? Specimen Description BLOOD RIGHT ANTECUBITAL  Final  ? Special Requests   Final  ?  BOTTLES DRAWN AEROBIC AND ANAEROBIC Blood Culture results may not be optimal due to an excessive volume of blood received in culture bottles  ? Culture   Final  ?  NO GROWTH < 12 HOURS ?Performed at [facility], [address] [place], [place] [address] ?  ? Report Status PENDING  Incomplete  ?  ? ? ? ? ? ?Radiology Studies: ?CT ABDOMEN PELVIS W CONTRAST ? ?Result Date: 11/26/2021 ?CLINICAL DATA:  Low back pain and fevers for 1 week EXAM: CT ABDOMEN AND PELVIS WITH CONTRAST TECHNIQUE: Multidetector CT imaging of the abdomen and pelvis was performed using the standard protocol following bolus administration of intravenous contrast. RADIATION DOSE REDUCTION: This exam was performed according to the departmental dose-optimization program which includes automated exposure control, adjustment of the mA and/or kV according to patient size and/or use of iterative reconstruction technique. CONTRAST:  173m OMNIPAQUE IOHEXOL 300 MG/ML  SOLN COMPARISON:  None. FINDINGS: Lower chest: No acute abnormality. Hepatobiliary: No focal liver abnormality is seen. No gallstones, gallbladder wall thickening, or biliary dilatation. Pancreas: Unremarkable. No pancreatic ductal dilatation or surrounding inflammatory changes. Spleen: Normal in size without focal abnormality. Adrenals/Urinary Tract: Adrenal glands are within normal limits. Kidneys are well visualized bilaterally within normal appearing enhancement pattern with the exception  of the posterior aspect of the right kidney where patchy decreased enhancement is noted. This likely represents an area of focal pyelonephritis. No associated obstructive changes are seen. No renal or ureteral calculi are noted. The bladder is partially distended. Stomach/Bowel: Colon is predominately decompressed. Stomach is distended with ingested food stuffs. Fluid is noted throughout the small bowel without obstructive changes. The appendix is within normal limits. Vascular/Lymphatic: No significant vascular findings are present. No enlarged abdominal or pelvic lymph nodes. Reproductive: Uterus and bilateral adnexa are unremarkable. Other: No abdominal wall hernia or abnormality. No abdominopelvic ascites. Musculoskeletal: Postsurgical changes in the proximal right hip are noted. No acute bony abnormality is seen. IMPRESSION: Patchy area of decreased enhancement identified in the posterior aspect of the right kidney best visualized on image number 31 of series 2. This is most consistent with focal pyelonephritis. No other focal abnormality is noted. Electronically Signed  By: Inez Catalina M.D.   On: 11/26/2021 19:45   ? ? ? ? ? ?Scheduled Meds: ? enoxaparin (LOVENOX) injection  40 mg Subcutaneous Q24H  ? potassium chloride  40 mEq Oral BID  ? senna-docusate  1 tablet Oral QHS  ? ?Continuous Infusions: ? cefTRIAXone (ROCEPHIN)  IV    ? lactated ringers 50 mL/hr at 11/26/21 2111  ? ? ? LOS: 1 day  ? ? ?Time spent: 30 mins ? ? ? ?Wyvonnia Dusky, MD ?[facility] ?Pager [phone] ? ?If 7PM-7AM, please contact night-coverage ?11/27/2021, 7:40 AM  ? ?

## 2021-11-27 NOTE — Progress Notes (Signed)
Patient is admitted for a kidney infection. Patient called out asking for a blanket and pain med for flank pain. Upon arriving into room, I noticed patient had turned room temp to 85 degrees and complained of being cold. Checked temp and it was 101.9. Gave pain med and tylenol for temp. Now on yellow MEWS for close observation. Blood cultures already done in ED. Notified charge nurse  and provider Sharion Settler. No additional interventions needed at this time. Will continue to monitor. ?

## 2021-11-27 NOTE — Plan of Care (Signed)
Initial ADD ?Problem: Education: ?Goal: Knowledge of General Education information will improve ?Description: Including pain rating scale, medication(s)/side effects and non-pharmacologic comfort measures ?Outcome: Not Applicable ?  ?Problem: Health Behavior/Discharge Planning: ?Goal: Ability to manage health-related needs will improve ?Outcome: Not Applicable ?  ?Problem: Clinical Measurements: ?Goal: Ability to maintain clinical measurements within normal limits will improve ?Outcome: Not Applicable ?Goal: Will remain free from infection ?Outcome: Not Applicable ?Goal: Diagnostic test results will improve ?Outcome: Not Applicable ?Goal: Respiratory complications will improve ?Outcome: Not Applicable ?Goal: Cardiovascular complication will be avoided ?Outcome: Not Applicable ?  ?Problem: Activity: ?Goal: Risk for activity intolerance will decrease ?Outcome: Not Applicable ?  ?Problem: Nutrition: ?Goal: Adequate nutrition will be maintained ?Outcome: Not Applicable ?  ?Problem: Coping: ?Goal: Level of anxiety will decrease ?Outcome: Not Applicable ?  ?Problem: Elimination: ?Goal: Will not experience complications related to bowel motility ?Outcome: Not Applicable ?Goal: Will not experience complications related to urinary retention ?Outcome: Not Applicable ?  ?Problem: Pain Managment: ?Goal: General experience of comfort will improve ?Outcome: Not Applicable ?  ?Problem: Safety: ?Goal: Ability to remain free from injury will improve ?Outcome: Not Applicable ?  ?Problem: Skin Integrity: ?Goal: Risk for impaired skin integrity will decrease ?Outcome: Not Applicable ?  ?Problem: Urinary Elimination: ?Goal: Signs and symptoms of infection will decrease ?Outcome: Not Applicable ?  ?

## 2021-11-28 DIAGNOSIS — N12 Tubulo-interstitial nephritis, not specified as acute or chronic: Secondary | ICD-10-CM | POA: Diagnosis not present

## 2021-11-28 DIAGNOSIS — E669 Obesity, unspecified: Secondary | ICD-10-CM | POA: Diagnosis not present

## 2021-11-28 DIAGNOSIS — R509 Fever, unspecified: Secondary | ICD-10-CM

## 2021-11-28 LAB — CBC
HCT: 34.8 % — ABNORMAL LOW (ref 36.0–46.0)
Hemoglobin: 11.7 g/dL — ABNORMAL LOW (ref 12.0–15.0)
MCH: 28.4 pg (ref 26.0–34.0)
MCHC: 33.6 g/dL (ref 30.0–36.0)
MCV: 84.5 fL (ref 80.0–100.0)
Platelets: 280 10*3/uL (ref 150–400)
RBC: 4.12 MIL/uL (ref 3.87–5.11)
RDW: 11.4 % — ABNORMAL LOW (ref 11.5–15.5)
WBC: 15.7 10*3/uL — ABNORMAL HIGH (ref 4.0–10.5)
nRBC: 0 % (ref 0.0–0.2)

## 2021-11-28 LAB — BASIC METABOLIC PANEL
Anion gap: 7 (ref 5–15)
BUN: 6 mg/dL (ref 6–20)
CO2: 30 mmol/L (ref 22–32)
Calcium: 8.8 mg/dL — ABNORMAL LOW (ref 8.9–10.3)
Chloride: 100 mmol/L (ref 98–111)
Creatinine, Ser: 0.65 mg/dL (ref 0.44–1.00)
GFR, Estimated: >60 mL/min (ref >60)
Glucose, Bld: 91 mg/dL (ref 70–99)
Potassium: 3.8 mmol/L (ref 3.5–5.1)
Sodium: 137 mmol/L (ref 135–145)

## 2021-11-28 LAB — HEMOGLOBIN A1C
Hgb A1c MFr Bld: 5.2 % (ref 4.8–5.6)
Mean Plasma Glucose: 102.54 mg/dL

## 2021-11-28 LAB — GLUCOSE, CAPILLARY
Glucose-Capillary: 77 mg/dL (ref 70–99)
Glucose-Capillary: 101 mg/dL — ABNORMAL HIGH (ref 70–99)

## 2021-11-28 NOTE — Progress Notes (Signed)
?PROGRESS NOTE ? ? ? ?Melissa Mercer  BMW:[id number] DOB: [birth year] DOA: 11/26/2021 ?PCP: Patient, No Pcp Per (Inactive)  ? ? ?Assessment & Plan: ?  ?Principal Problem: ?  Pyelonephritis of right kidney ? ? ?Sepsis: met criteria w/ fever, leukocytosis, tachycardia & likely pyelonephritis.  ?Continue on IV ceftriaxone. Blood cx NGTD. Urine cx growing gram neg rods.  ? ?Pyelonephritis: on the right as per CT. Continue on IV rocephin. Urine cx growing gram neg rods, sens pending. Still spiking fevers  ?  ?Hypokalemia: resolved   ?  ?Hyperglycemia: no hx of DM. HbA1c is pending  ?  ?Obesity: BMI 30.8. Would benefit from weight loss  ? ? ?  ? ? ?DVT prophylaxis: lovenox  ?Code Status: full  ?Family Communication:  ?Disposition Plan: likely d/c back home  ? ?Level of care: Telemetry Medical ? ?Status is: Inpatient ?Remains inpatient appropriate because: requiring IV abxs & still spiking fevers  ? ? ? ?Consultants:  ? ? ?Procedures:  ? ?Antimicrobials: rocephin  ? ? ?Subjective: ?Pt c/o malaise   ? ?Objective: ?Vitals:  ? 11/27/21 1136 11/27/21 1705 11/27/21 2058 11/28/21 0612  ?BP: 101/61 104/68 113/69 93/60  ?Pulse: 85 96 93 88  ?Resp: '16 16 16 16  ' ?Temp: 98.6 ?F (37 ?C) (!) 100.7 ?F (38.2 ?C) (!) 100.9 ?F (38.3 ?C) 99 ?F (37.2 ?C)  ?TempSrc:  Oral    ?SpO2: 100% 100% 100% 99%  ?Weight:      ?Height:      ? ? ?Intake/Output Summary (Last 24 hours) at 11/28/2021 0728 ?Last data filed at 11/28/2021 0355 ?Gross per 24 hour  ?Intake 2109.44 ml  ?Output --  ?Net 2109.44 ml  ? ?Filed Weights  ? 11/26/21 1802 11/26/21 2102  ?Weight: 80.7 kg 81.4 kg  ? ? ?Examination: ? ?General exam: Appears calm, comfortable  ?Respiratory system: clear breath sounds b/l  ?Cardiovascular system: S1/S2+. No rubs or clicks   ?Gastrointestinal system: Abd is soft, NT, obese & hypoactive bowel sounds  ?Central nervous system: Alert and oriented. Moves all extremities  ?Psychiatry: Judgement and insight appears normal. Appropriate mood and affect.   ? ? ? ?Data Reviewed: I have personally reviewed following labs and imaging studies ? ?CBC: ?Recent Labs  ?Lab 11/26/21 ?1825 11/27/21 ?0448 11/28/21 ?0500  ?WBC 13.4* 12.2* 15.7*  ?HGB 13.1 12.1 11.7*  ?HCT 40.6 36.9 34.8*  ?MCV 85.3 86.4 84.5  ?PLT 330 303 280  ? ?Basic Metabolic Panel: ?Recent Labs  ?Lab 11/26/21 ?1825 11/27/21 ?0448 11/28/21 ?0500  ?NA 137 137 137  ?K 3.2* 4.1 3.8  ?CL 100 104 100  ?CO2 '27 27 30  ' ?GLUCOSE 123* 107* 91  ?BUN '10 9 6  ' ?CREATININE 0.72 0.73 0.65  ?CALCIUM 9.1 8.6* 8.8*  ?MG  --  2.2  --   ?PHOS  --  2.3*  --   ? ?GFR: ?Estimated Creatinine Clearance: 108.1 mL/min (by C-G formula based on SCr of 0.65 mg/dL). ?Liver Function Tests: ?Recent Labs  ?Lab 11/27/21 ?0448  ?AST 36  ?ALT 35  ?ALKPHOS 61  ?BILITOT 0.7  ?PROT 7.0  ?ALBUMIN 3.3*  ? ?No results for input(s): LIPASE, AMYLASE in the last 168 hours. ?No results for input(s): AMMONIA in the last 168 hours. ?Coagulation Profile: ?No results for input(s): INR, PROTIME in the last 168 hours. ?Cardiac Enzymes: ?No results for input(s): CKTOTAL, CKMB, CKMBINDEX, TROPONINI in the last 168 hours. ?BNP (last 3 results) ?No results for input(s): PROBNP in the last 8760 hours. ?HbA1C: ?  No results for input(s): HGBA1C in the last 72 hours. ?CBG: ?Recent Labs  ?Lab 11/27/21 ?0735 11/27/21 ?1801  ?GLUCAP 85 107*  ? ?Lipid Profile: ?No results for input(s): CHOL, HDL, LDLCALC, TRIG, CHOLHDL, LDLDIRECT in the last 72 hours. ?Thyroid Function Tests: ?No results for input(s): TSH, T4TOTAL, FREET4, T3FREE, THYROIDAB in the last 72 hours. ?Anemia Panel: ?No results for input(s): VITAMINB12, FOLATE, FERRITIN, TIBC, IRON, RETICCTPCT in the last 72 hours. ?Sepsis Labs: ?Recent Labs  ?Lab 11/26/21 ?1825  ?LATICACIDVEN 1.3  ? ? ?Recent Results (from the past 240 hour(s))  ?Blood culture (routine x 2)     Status: None (Preliminary result)  ? Collection Time: 11/26/21  8:03 PM  ? Specimen: BLOOD  ?Result Value Ref Range Status  ? Specimen Description BLOOD  LEFT ANTECUBITAL  Final  ? Special Requests   Final  ?  BOTTLES DRAWN AEROBIC AND ANAEROBIC Blood Culture results may not be optimal due to an excessive volume of blood received in culture bottles  ? Culture   Final  ?  NO GROWTH < 12 HOURS ?Performed at [facility], [address], [place], [place] [address] ?  ? Report Status PENDING  Incomplete  ?Blood culture (routine x 2)     Status: None (Preliminary result)  ? Collection Time: 11/26/21  8:03 PM  ? Specimen: BLOOD  ?Result Value Ref Range Status  ? Specimen Description BLOOD RIGHT ANTECUBITAL  Final  ? Special Requests   Final  ?  BOTTLES DRAWN AEROBIC AND ANAEROBIC Blood Culture results may not be optimal due to an excessive volume of blood received in culture bottles  ? Culture   Final  ?  NO GROWTH < 12 HOURS ?Performed at [facility], [address], [place], [place] [address] ?  ? Report Status PENDING  Incomplete  ?  ? ? ? ? ? ?Radiology Studies: ?CT ABDOMEN PELVIS W CONTRAST ? ?Result Date: 11/26/2021 ?CLINICAL DATA:  Low back pain and fevers for 1 week EXAM: CT ABDOMEN AND PELVIS WITH CONTRAST TECHNIQUE: Multidetector CT imaging of the abdomen and pelvis was performed using the standard protocol following bolus administration of intravenous contrast. RADIATION DOSE REDUCTION: This exam was performed according to the departmental dose-optimization program which includes automated exposure control, adjustment of the mA and/or kV according to patient size and/or use of iterative reconstruction technique. CONTRAST:  162m OMNIPAQUE IOHEXOL 300 MG/ML  SOLN COMPARISON:  None. FINDINGS: Lower chest: No acute abnormality. Hepatobiliary: No focal liver abnormality is seen. No gallstones, gallbladder wall thickening, or biliary dilatation. Pancreas: Unremarkable. No pancreatic ductal dilatation or surrounding inflammatory changes. Spleen: Normal in size without focal abnormality. Adrenals/Urinary Tract: Adrenal glands are within normal  limits. Kidneys are well visualized bilaterally within normal appearing enhancement pattern with the exception of the posterior aspect of the right kidney where patchy decreased enhancement is noted. This likely represents an area of focal pyelonephritis. No associated obstructive changes are seen. No renal or ureteral calculi are noted. The bladder is partially distended. Stomach/Bowel: Colon is predominately decompressed. Stomach is distended with ingested food stuffs. Fluid is noted throughout the small bowel without obstructive changes. The appendix is within normal limits. Vascular/Lymphatic: No significant vascular findings are present. No enlarged abdominal or pelvic lymph nodes. Reproductive: Uterus and bilateral adnexa are unremarkable. Other: No abdominal wall hernia or abnormality. No abdominopelvic ascites. Musculoskeletal: Postsurgical changes in the proximal right hip are noted. No acute bony abnormality is seen. IMPRESSION: Patchy area of decreased enhancement identified  in the posterior aspect of the right kidney best visualized on image number 31 of series 2. This is most consistent with focal pyelonephritis. No other focal abnormality is noted. Electronically Signed   By: Inez Catalina M.D.   On: 11/26/2021 19:45   ? ? ? ? ? ?Scheduled Meds: ? enoxaparin (LOVENOX) injection  40 mg Subcutaneous Q24H  ? ibuprofen  600 mg Oral Once  ? senna-docusate  1 tablet Oral QHS  ? ?Continuous Infusions: ? cefTRIAXone (ROCEPHIN)  IV Stopped (11/27/21 2139)  ? lactated ringers 50 mL/hr at 11/28/21 0355  ? ? ? LOS: 2 days  ? ? ?Time spent: 25 mins ? ? ? ?Wyvonnia Dusky, MD ?[facility] ?Pager [phone] ? ?If 7PM-7AM, please contact night-coverage ?11/28/2021, 7:28 AM  ? ?

## 2021-11-29 DIAGNOSIS — N390 Urinary tract infection, site not specified: Secondary | ICD-10-CM | POA: Diagnosis not present

## 2021-11-29 DIAGNOSIS — A419 Sepsis, unspecified organism: Secondary | ICD-10-CM | POA: Diagnosis not present

## 2021-11-29 DIAGNOSIS — N12 Tubulo-interstitial nephritis, not specified as acute or chronic: Secondary | ICD-10-CM | POA: Diagnosis not present

## 2021-11-29 DIAGNOSIS — E669 Obesity, unspecified: Secondary | ICD-10-CM | POA: Diagnosis not present

## 2021-11-29 LAB — BASIC METABOLIC PANEL
Anion gap: 7 (ref 5–15)
BUN: 8 mg/dL (ref 6–20)
CO2: 31 mmol/L (ref 22–32)
Calcium: 9.2 mg/dL (ref 8.9–10.3)
Chloride: 101 mmol/L (ref 98–111)
Creatinine, Ser: 0.56 mg/dL (ref 0.44–1.00)
GFR, Estimated: >60 mL/min (ref >60)
Glucose, Bld: 90 mg/dL (ref 70–99)
Potassium: 4.3 mmol/L (ref 3.5–5.1)
Sodium: 139 mmol/L (ref 135–145)

## 2021-11-29 LAB — URINE CULTURE: Culture: >=100000 — AB

## 2021-11-29 LAB — CBC
HCT: 36.2 % (ref 36.0–46.0)
Hemoglobin: 11.9 g/dL — ABNORMAL LOW (ref 12.0–15.0)
MCH: 27.7 pg (ref 26.0–34.0)
MCHC: 32.9 g/dL (ref 30.0–36.0)
MCV: 84.4 fL (ref 80.0–100.0)
Platelets: 321 10*3/uL (ref 150–400)
RBC: 4.29 MIL/uL (ref 3.87–5.11)
RDW: 11.5 % (ref 11.5–15.5)
WBC: 10.1 10*3/uL (ref 4.0–10.5)
nRBC: 0 % (ref 0.0–0.2)

## 2021-11-29 MED ORDER — OXYCODONE HCL 5 MG PO TABS: 5.0000 mg | ORAL_TABLET | Freq: Four times a day (QID) | ORAL | 0 refills | Status: AC | PRN

## 2021-11-29 MED ORDER — CIPROFLOXACIN HCL 500 MG PO TABS: 500.0000 mg | ORAL_TABLET | Freq: Two times a day (BID) | ORAL | 0 refills | Status: AC

## 2021-11-29 NOTE — Discharge Summary (Signed)
Physician Discharge Summary  ?[name] JFH:[id number] DOB: Jan 10, 1996 DOA: 11/26/2021 ? ?PCP: Patient, No Pcp Per (Inactive) ? ?Admit date: 11/26/2021 ?Discharge date: 11/29/2021 ? ?Admitted From: home  ?Disposition:  home  ? ?Recommendations for Outpatient Follow-up:  ?Follow up with PCP in 1-2 weeks ? ?Home Health: no  ?Equipment/Devices: ? ?Discharge Condition: stable  ?CODE STATUS: full  ?Diet recommendation: regular  ? ?Brief/Interim Summary: ?HPI was taken from Dr. Nevada Crane: ?Melissa Mercer is a 26 y.o. female with no significant past medical history who presented to [facility] ED from home with complaints of a week and half of intermittent fevers, bilateral flank pain.  Associated with increased urinary frequency, lower abdominal pain, nausea and vomiting x1 today.  Upon presentation to the ED, the patient was found to be febrile with Tmax of 102.6, leukocytosis, tachycardia, urine analysis positive for pyuria, CT findings of right pyelonephritis.  Blood cultures and urine culture were sent.  Patient received 2 L of IV fluid boluses, and was started on IV antibiotics empirically, Rocephin.  EDP called to admit by hospitalist service, [facility]. ?  ?ED Course: Tmax 102.7.  BP 115/71, pulse 107, respiration rate 18, O2 saturation 99% on room air.  Lab studies remarkable for serum potassium 3.2, glucose 123, WBC 13.4, UA positive for pyuria. ?  ?Hospital course from Dr. Jimmye Norman 4/8-4/10/23: Pt was found to have sepsis secondary to pyelonephritis. Urine cx grew e. coli and pt was receiving IV rocephin inpatient and was d/c home on po cipro to complete the course. Pt did spike fevers while inpatient but pt was afebrile x 24 hrs prior to d/c.  ? ? ?Discharge Diagnoses:  ?Principal Problem: ?  Pyelonephritis of right kidney ? ?Sepsis: met criteria w/ fever, leukocytosis, tachycardia & likely pyelonephritis.  ?Continue on IV ceftriaxone while inpatient . Blood cx NGTD. Urine cx growing e. coli. Sepsis resolved    ? ?Pyelonephritis: on the right as per CT. Continue on IV rocephin. Afebrile x 24 hrs. Urine cx grew e. coli. D/c home on po cipro   ?  ?Hypokalemia: resolved   ?  ?Hyperglycemia: no hx of DM. HbA1c is 5.2 ?  ?Obesity: BMI 30.8. Would benefit from weight loss  ? ? ?Discharge Instructions ? ?Discharge Instructions   ? ? Diet general   Complete by: As directed ?  ? Discharge instructions   Complete by: As directed ?  ? F/u w/ PCP in 1-2 weeks.  ? Increase activity slowly   Complete by: As directed ?  ? ?  ? ?Allergies as of 11/29/2021   ? ?   Reactions  ? Amoxicillin-pot Clavulanate Hives  ? Amoxicillin Nausea And Vomiting  ? Other reaction(s): nausea vomiting  ? ?  ? ?  ?Medication List  ?  ? ?TAKE these medications   ? ?ciprofloxacin 500 MG tablet ?Commonly known as: Cipro ?Take 1 tablet (500 mg total) by mouth 2 (two) times daily for 10 days. ?  ?oxyCODONE 5 MG immediate release tablet ?Commonly known as: Oxy IR/ROXICODONE ?Take 1 tablet (5 mg total) by mouth every 6 (six) hours as needed for up to 3 days for severe pain or moderate pain. ?  ? ?  ? ? ?Allergies  ?Allergen Reactions  ? Amoxicillin-Pot Clavulanate Hives  ? Amoxicillin Nausea And Vomiting  ?  Other reaction(s): nausea vomiting  ? ? ?Consultations: ? ? ? ?Procedures/Studies: ?CT ABDOMEN PELVIS W CONTRAST ? ?Result Date: 11/26/2021 ?CLINICAL DATA:  Low back pain and fevers for 1  week EXAM: CT ABDOMEN AND PELVIS WITH CONTRAST TECHNIQUE: Multidetector CT imaging of the abdomen and pelvis was performed using the standard protocol following bolus administration of intravenous contrast. RADIATION DOSE REDUCTION: This exam was performed according to the departmental dose-optimization program which includes automated exposure control, adjustment of the mA and/or kV according to patient size and/or use of iterative reconstruction technique. CONTRAST:  145m OMNIPAQUE IOHEXOL 300 MG/ML  SOLN COMPARISON:  None. FINDINGS: Lower chest: No acute abnormality.  Hepatobiliary: No focal liver abnormality is seen. No gallstones, gallbladder wall thickening, or biliary dilatation. Pancreas: Unremarkable. No pancreatic ductal dilatation or surrounding inflammatory changes. Spleen: Normal in size without focal abnormality. Adrenals/Urinary Tract: Adrenal glands are within normal limits. Kidneys are well visualized bilaterally within normal appearing enhancement pattern with the exception of the posterior aspect of the right kidney where patchy decreased enhancement is noted. This likely represents an area of focal pyelonephritis. No associated obstructive changes are seen. No renal or ureteral calculi are noted. The bladder is partially distended. Stomach/Bowel: Colon is predominately decompressed. Stomach is distended with ingested food stuffs. Fluid is noted throughout the small bowel without obstructive changes. The appendix is within normal limits. Vascular/Lymphatic: No significant vascular findings are present. No enlarged abdominal or pelvic lymph nodes. Reproductive: Uterus and bilateral adnexa are unremarkable. Other: No abdominal wall hernia or abnormality. No abdominopelvic ascites. Musculoskeletal: Postsurgical changes in the proximal right hip are noted. No acute bony abnormality is seen. IMPRESSION: Patchy area of decreased enhancement identified in the posterior aspect of the right kidney best visualized on image number 31 of series 2. This is most consistent with focal pyelonephritis. No other focal abnormality is noted. Electronically Signed   By: MInez CatalinaM.D.   On: 11/26/2021 19:45   ?(Echo, Carotid, EGD, Colonoscopy, ERCP)  ? ? ?Subjective: Pt c/o fatigue  ? ? ?Discharge Exam: ?Vitals:  ? 11/29/21 0615 11/29/21 0830  ?BP: 106/63 113/65  ?Pulse: 72 89  ?Resp: 16 16  ?Temp: 98.3 ?F (36.8 ?C) 98 ?F (36.7 ?C)  ?SpO2: 99% 100%  ? ?Vitals:  ? 11/28/21 1525 11/28/21 2000 11/29/21 0615 11/29/21 0830  ?BP: 106/74 110/74 106/63 113/65  ?Pulse: 88 85 72 89  ?Resp:  _0 ?Temp: 98.4 ?F (36.9 ?C) 98.2 ?F (36.8 ?C) 98.3 ?F (36.8 ?C) 98 ?F (36.7 ?C)  ?TempSrc:   Oral Oral  ?SpO2: 100% 100% 99% 100%  ?Weight:      ?Height:      ? ? ?General: Pt is alert, awake, not in acute distress ?Cardiovascular: S1/S2 +, no rubs, no gallops ?Respiratory: CTA bilaterally, no wheezing, no rhonchi ?Abdominal: Soft, NT, obese, bowel sounds + ?Extremities: no edema, no cyanosis ? ? ? ?The results of significant diagnostics from this hospitalization (including imaging, microbiology, ancillary and laboratory) are listed below for reference.   ? ? ?Microbiology: ?Recent Results (from the past 240 hour(s))  ?Urine Culture     Status: Abnormal  ? Collection Time: 11/26/21  6:25 PM  ? Specimen: Urine, Random  ?Result Value Ref Range Status  ? Specimen Description   Final  ?  URINE, RANDOM ?[facility]formed at [facility] [address] [place] [address]?  ? Special Requests   Final  ?  NONE ?[facility] [address] [place] [address]?  ? Culture >=100,000 COLONIES/mL ESCHERICHIA COLI (A)  Final  ? Report Status 11/29/2021 FINAL  Final  ? Organism ID, Bacteria ESCHERICHIA  COLI (A)  Final  ?    Susceptibility  ? Escherichia coli - MIC*  ?  AMPICILLIN >=32 RESISTANT Resistant   ?  CEFAZOLIN 16 SENSITIVE Sensitive   ?  CEFEPIME <=0.12 SENSITIVE Sensitive   ?  CEFTRIAXONE <=0.25 SENSITIVE Sensitive   ?  CIPROFLOXACIN <=0.25 SENSITIVE Sensitive   ?  GENTAMICIN >=16 RESISTANT Resistant   ?  IMIPENEM <=0.25 SENSITIVE Sensitive   ?  NITROFURANTOIN <=16 SENSITIVE Sensitive   ?  TRIMETH/SULFA >=320 RESISTANT Resistant   ?  AMPICILLIN/SULBACTAM >=32 RESISTANT Resistant   ?  PIP/TAZO 64 INTERMEDIATE Intermediate   ?  * >=100,000 COLONIES/mL ESCHERICHIA COLI  ?Blood culture (routine x 2)     Status: None (Preliminary result)  ? Collection Time: 11/26/21  8:03 PM  ? Specimen: BLOOD  ?Result Value Ref Range Status  ? Specimen Description BLOOD LEFT ANTECUBITAL   Final  ? Special Requests   Final  ?  BOTTLES DRAWN AEROBIC AND ANAEROBIC Blood Culture results may not be optimal due to an excessive volume of blood received in culture bottles  ? Culture   Final  ?  NO GROWTH 3 DAYS ?Perfo

## 2021-11-29 NOTE — Progress Notes (Signed)
?   11/27/21 0236  ?Assess: MEWS Score  ?Temp (!) 101.9 ?F (38.8 ?C)  ?BP 128/61  ?Pulse Rate (!) 102  ?Resp 16  ?Level of Consciousness Alert  ?SpO2 100 %  ?O2 Device Room Air  ?Assess: MEWS Score  ?MEWS Temp 2  ?MEWS Systolic 0  ?MEWS Pulse 1  ?MEWS RR 0  ?MEWS LOC 0  ?MEWS Score 3  ?MEWS Score Color Yellow  ?Assess: if the MEWS score is Yellow or Red  ?Were vital signs taken at a resting state? Yes  ?Focused Assessment No change from prior assessment  ?Does the patient meet 2 or more of the SIRS criteria? Yes  ?Does the patient have a confirmed or suspected source of infection? Yes  ?Provider and Rapid Response Notified? Yes ?(NOTIFIED PROVIDER BRENDA MORRISON @ [phone])  ?MEWS guidelines implemented *See Row Information* Yes  ?Treat  ?MEWS Interventions Administered prn meds/treatments  ?Pain Scale 0-10  ?Pain Score 8  ?Pain Type Acute pain  ?Pain Location Flank  ?Pain Orientation Lower  ?Pain Descriptors / Indicators Aching  ?Pain Frequency Intermittent  ?Pain Onset Gradual  ?Patients Stated Pain Goal 0  ?Pain Intervention(s) Medication (See eMAR)  ?Take Vital Signs  ?Increase Vital Sign Frequency  Yellow: Q 2hr X 2 then Q 4hr X 2, if remains yellow, continue Q 4hrs  ?Escalate  ?MEWS: Escalate Yellow: discuss with charge nurse/RN and consider discussing with provider and RRT  ?Notify: Charge Nurse/RN  ?Name of Charge Nurse/RN Notified Annice Pih, RN CHARGE NURSE  ?Date Charge Nurse/RN Notified 11/27/21  ?Time Charge Nurse/RN Notified 0240  ?Notify: Provider  ?Provider Name/Title Manuela Schwartz NP  ?Date Provider Notified 11/27/21  ?Time Provider Notified [phone]  ?Notification Type Page ?(MESSAGE)  ?Notification Reason Other (Comment) ?(ELEVATED TEMP/YELLOW MEWS)  ?Provider response No new orders  ?Date of Provider Response 11/27/21  ?Time of Provider Response [phone]  ?Document  ?Progress note created (see row info) Yes ?(SEE NOTE)  ?Assess: SIRS CRITERIA  ?SIRS Temperature  1  ?SIRS Pulse 1  ?SIRS Respirations  0  ?SIRS  WBC 1  ?SIRS Score Sum  3  ? ? ?

## 2021-12-01 LAB — CULTURE, BLOOD (ROUTINE X 2)
Culture: NO GROWTH
Culture: NO GROWTH

## 2022-05-05 IMAGING — CR DG CHEST 2V
2 series · 2 of 2 positions shown · non-contrast
Comparison: None.

CLINICAL DATA: Cough and shortness of breath.

EXAM:
CHEST - 2 VIEW

[chest pa]
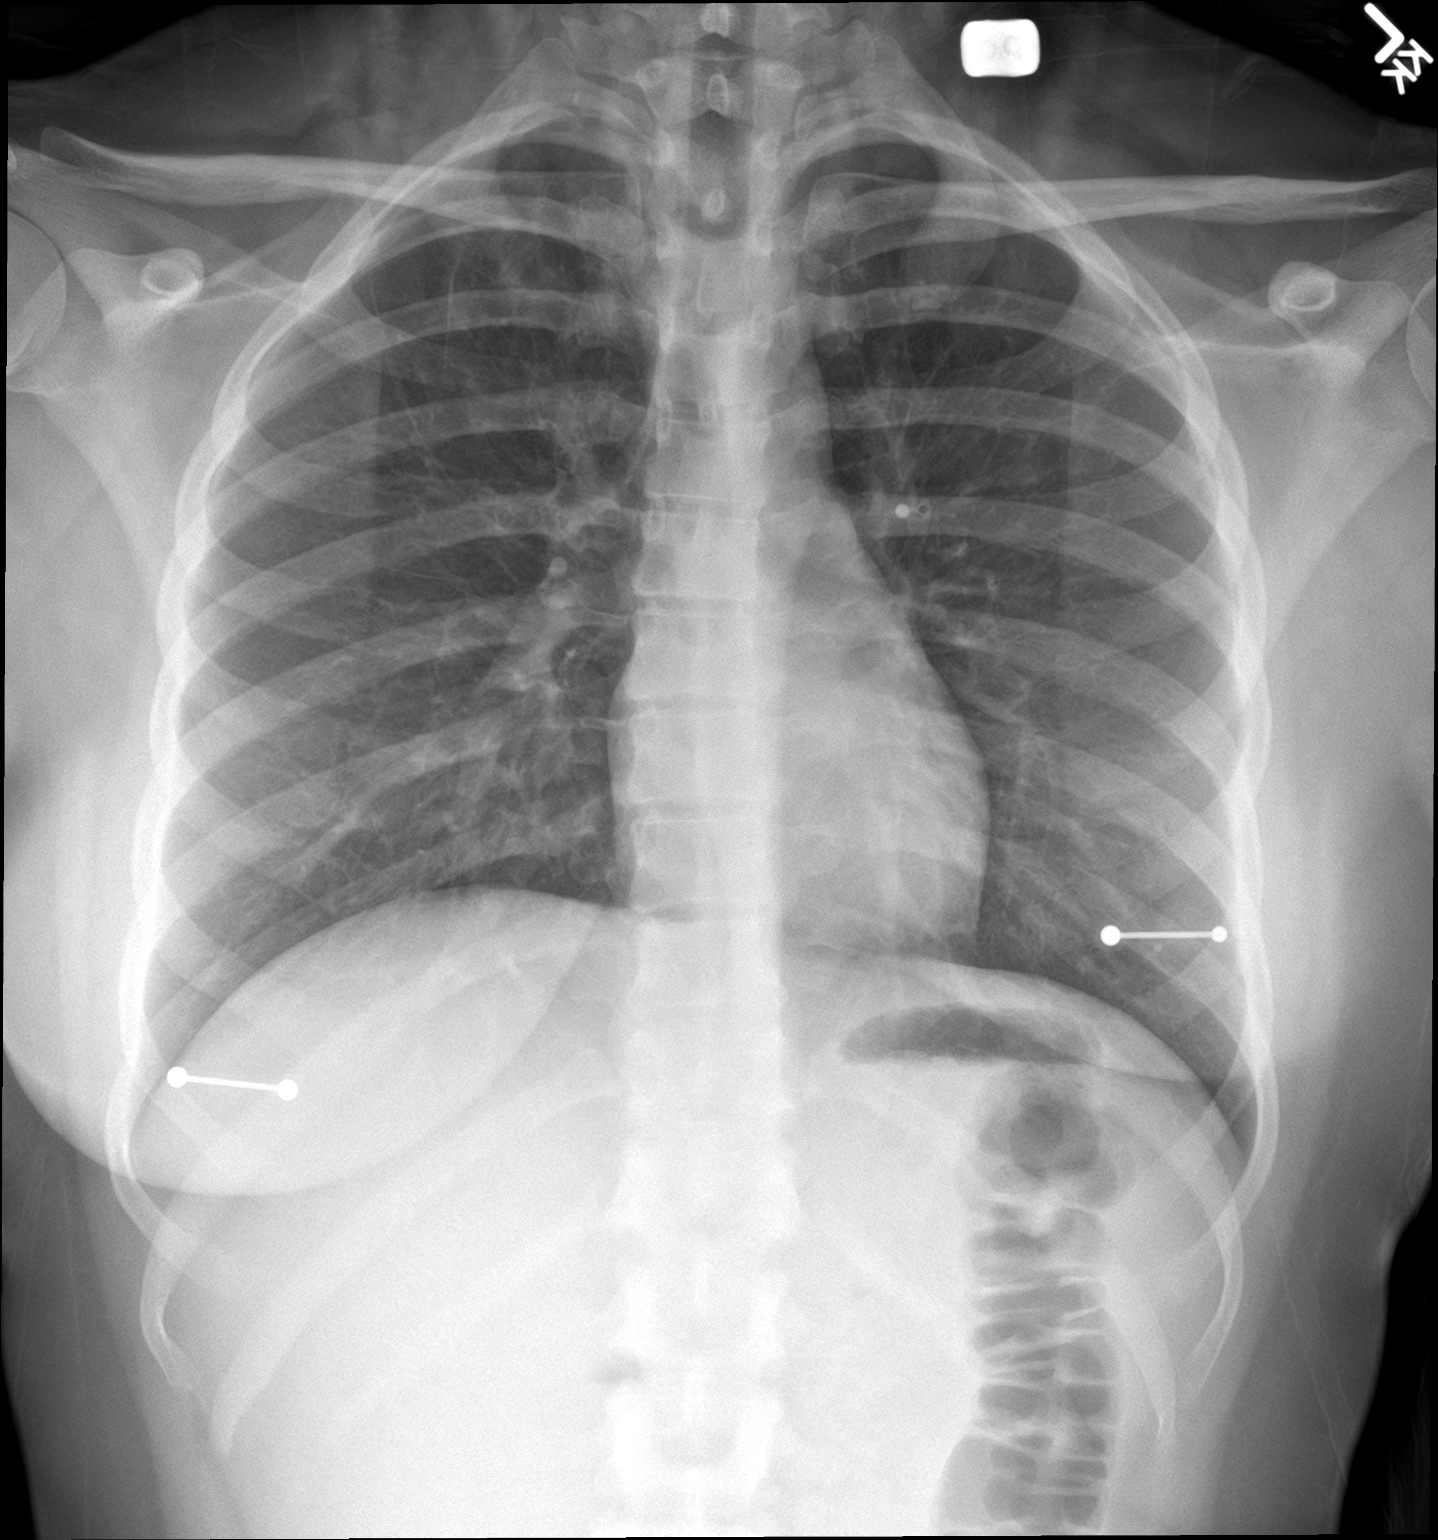

[chest lat]
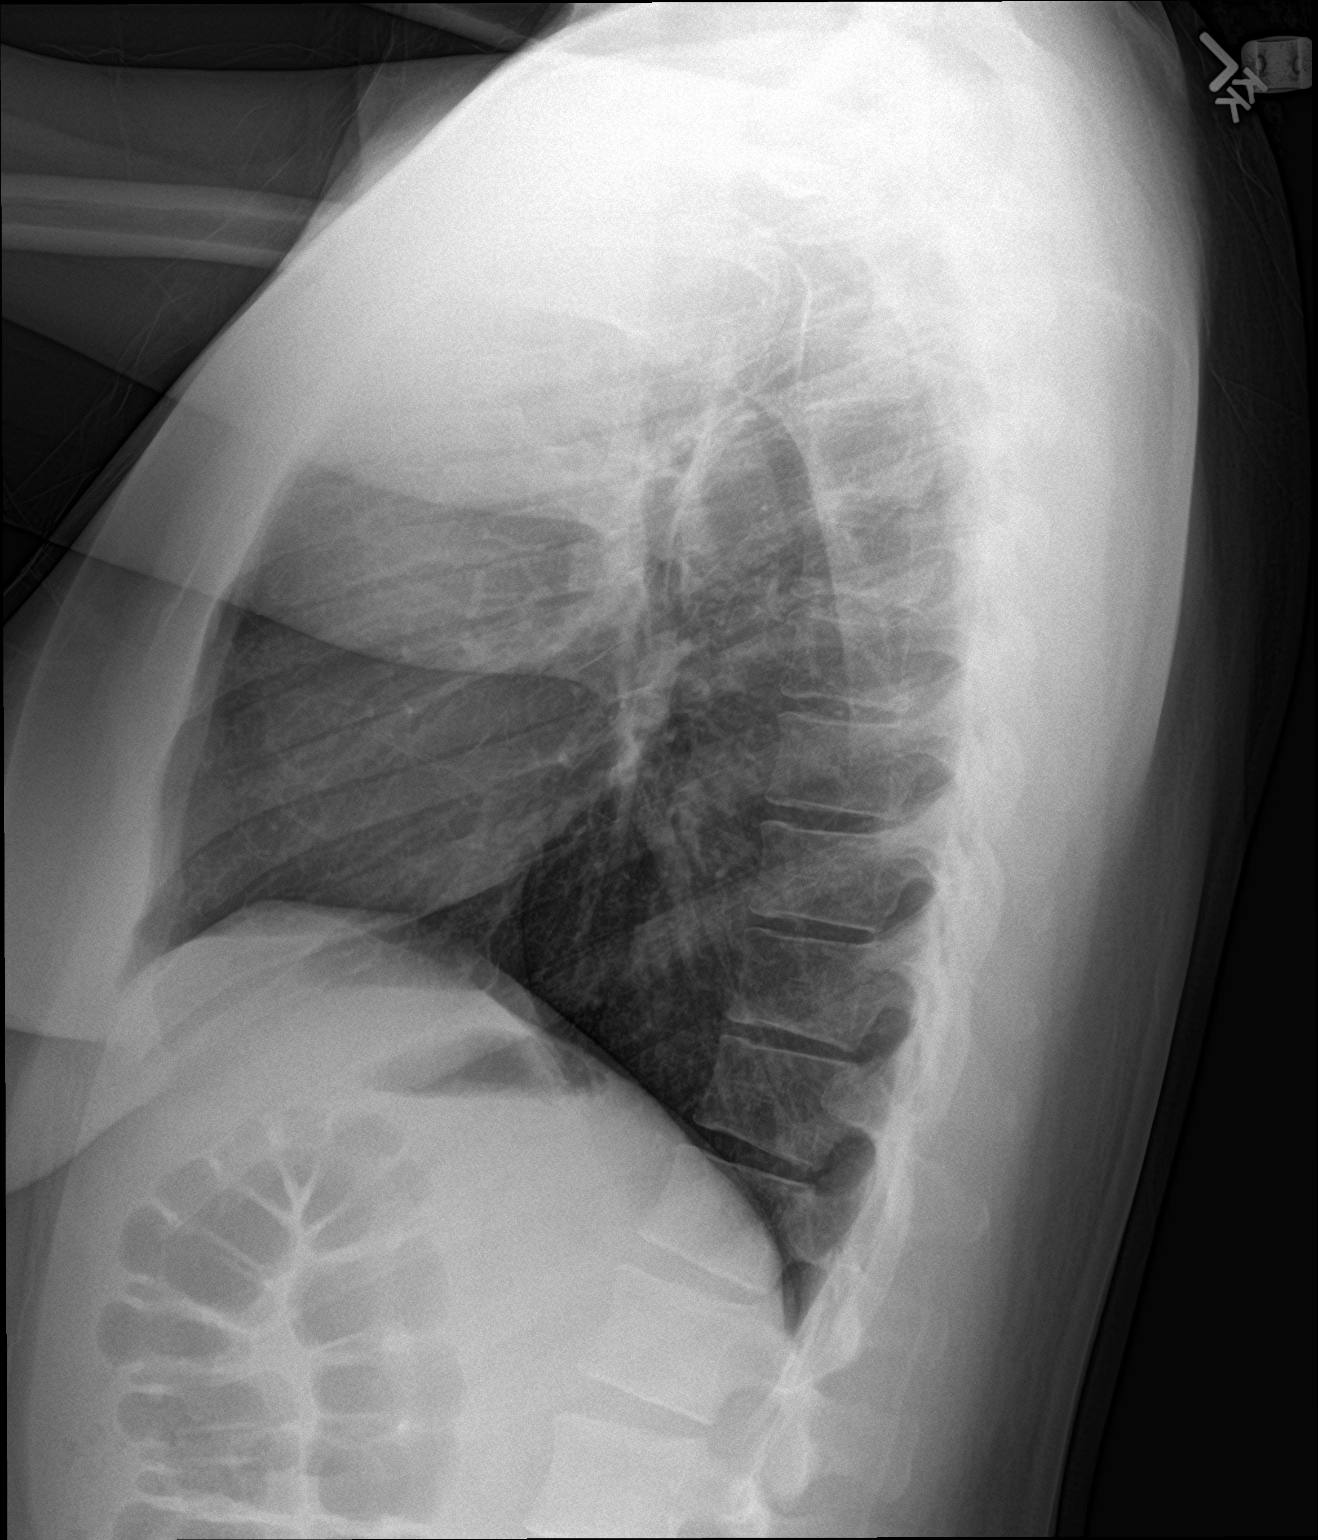

[2 of 2 positions shown; findings below may reference images not displayed]

FINDINGS: The cardiomediastinal silhouette is unremarkable.

There is no evidence of focal airspace disease, pulmonary edema,
suspicious pulmonary nodule/mass, pleural effusion, or pneumothorax.

No acute bony abnormalities are identified.
IMPRESSION: No active cardiopulmonary disease.

## 2022-09-29 ENCOUNTER — Encounter: Payer: Self-pay | Admitting: Obstetrics and Gynecology

## 2022-09-29 ENCOUNTER — Other Ambulatory Visit: Payer: Self-pay

## 2022-09-29 ENCOUNTER — Observation Stay
Admission: EM | Admit: 2022-09-29 | Discharge: 2022-09-29 | Disposition: A | Discharge status: Home / Self Care | Payer: BC Managed Care – PPO | Attending: Obstetrics and Gynecology | Admitting: Obstetrics and Gynecology

## 2022-09-29 DIAGNOSIS — J101 Influenza due to other identified influenza virus with other respiratory manifestations: Secondary | ICD-10-CM | POA: Insufficient documentation

## 2022-09-29 DIAGNOSIS — O98513 Other viral diseases complicating pregnancy, third trimester: Principal | ICD-10-CM | POA: Insufficient documentation

## 2022-09-29 DIAGNOSIS — Z3A28 28 weeks gestation of pregnancy: Secondary | ICD-10-CM | POA: Insufficient documentation

## 2022-09-29 DIAGNOSIS — Z1152 Encounter for screening for COVID-19: Secondary | ICD-10-CM | POA: Diagnosis not present

## 2022-09-29 DIAGNOSIS — N898 Other specified noninflammatory disorders of vagina: Secondary | ICD-10-CM | POA: Diagnosis present

## 2022-09-29 DIAGNOSIS — O0993 Supervision of high risk pregnancy, unspecified, third trimester: Secondary | ICD-10-CM | POA: Insufficient documentation

## 2022-09-29 DIAGNOSIS — Z79899 Other long term (current) drug therapy: Secondary | ICD-10-CM | POA: Insufficient documentation

## 2022-09-29 DIAGNOSIS — O99891 Other specified diseases and conditions complicating pregnancy: Secondary | ICD-10-CM | POA: Diagnosis present

## 2022-09-29 LAB — WET PREP, GENITAL
Clue Cells Wet Prep HPF POC: NONE SEEN
Sperm: NONE SEEN
Trich, Wet Prep: NONE SEEN
WBC, Wet Prep HPF POC: <10 (ref <10)
Yeast Wet Prep HPF POC: NONE SEEN

## 2022-09-29 LAB — RESP PANEL BY RT-PCR (RSV, FLU A&B, COVID)  RVPGX2
Influenza A by PCR: NEGATIVE
Influenza B by PCR: POSITIVE — AB
Resp Syncytial Virus by PCR: NEGATIVE
SARS Coronavirus 2 by RT PCR: NEGATIVE

## 2022-09-29 MED ORDER — ACETAMINOPHEN 500 MG PO TABS
1000.0000 mg | ORAL_TABLET | Freq: Four times a day (QID) | ORAL | Status: DC | PRN
  Administered 2022-09-29: 1000 mg via ORAL
  Filled 2022-09-29: qty 2

## 2022-09-29 MED ORDER — OSELTAMIVIR PHOSPHATE 75 MG PO CAPS
75.0000 mg | ORAL_CAPSULE | Freq: Two times a day (BID) | ORAL | Status: DC
  Filled 2022-09-29: qty 1

## 2022-09-29 MED ORDER — GUAIFENESIN 100 MG/5ML PO LIQD
5.0000 mL | ORAL | Status: DC | PRN
  Filled 2022-09-29: qty 5
  Filled 2022-09-29: qty 10

## 2022-09-29 MED ORDER — ACETAMINOPHEN 500 MG PO TABS: 1000.0000 mg | ORAL_TABLET | Freq: Four times a day (QID) | ORAL | 0 refills | Status: AC | PRN

## 2022-09-29 MED ORDER — GUAIFENESIN 100 MG/5ML PO LIQD: 5.0000 mL | ORAL | 0 refills | Status: AC | PRN

## 2022-09-29 MED ORDER — OSELTAMIVIR PHOSPHATE 75 MG PO CAPS: 75.0000 mg | ORAL_CAPSULE | Freq: Two times a day (BID) | ORAL | 0 refills | Status: AC

## 2022-09-29 NOTE — OB Triage Note (Signed)
Pt discharged home per order.   Pt stable and ambulatory and an After Visit Summary was printed and given to the patient.  Discharge education completed with patient/family including follow up instructions, appointments, and medication list. Patient able to verbalize understanding, all questions fully answered upon discharge. Patient instructed to return to ED, call 911, or call MD for any changes in condition.  Pt sent home with flu education. Prescriptions sent to pharmacy.

## 2022-09-29 NOTE — Discharge Summary (Signed)
Melissa Mercer is a 27 y.o. female. She is at [redacted]w[redacted]d gestation. Patient's last menstrual period was 09/13/2021 (exact date). Estimated Date of Delivery: 12/16/22  Prenatal care site: [facility] [address] [facility]- UNASSIGNED  Current pregnancy complicated by:  - normal pregnancy - last prenatal visit on 09/22/22  Chief complaint: 2-3 days ago, developed productive cough with green mucus, headache and nasal congestion. Her child is also sick with same sx and is currently being evaluated in the ER for same. Pt reports no UCs, LOF or VB. Though she had a little yellow mucus yesterday while coughing.    S: Resting comfortably. no CTX, no VB.no LOF,  Active fetal movement. Denies: HA, visual changes, SOB, or RUQ/epigastric pain  Maternal Medical History:   Past Medical History:  Diagnosis Date   Anemia    Blood transfusion without reported diagnosis     Past Surgical History:  Procedure Laterality Date   FEMUR CLOSED REDUCTION  2013    Allergies  Allergen Reactions   Amoxicillin-Pot Clavulanate Hives   Amoxicillin Nausea And Vomiting    Other reaction(s): nausea vomiting    Prior to Admission medications   Medication Sig Start Date End Date Taking? Authorizing Provider  Prenatal Vit-Fe Fumarate-FA (PRENATAL MULTIVITAMIN) TABS tablet Take 1 tablet by mouth daily at 12 noon.   Yes [provider]  acetaminophen (TYLENOL) 500 MG tablet Take 2 tablets (1,000 mg total) by mouth every 6 (six) hours as needed for fever or headache. 09/29/22   Jansen Sciuto, Murray Hodgkins, CNM  guaiFENesin (ROBITUSSIN) 100 MG/5ML liquid Take 5 mLs by mouth every 4 (four) hours as needed for cough or to loosen phlegm. 09/29/22   Corky Blumstein, Murray Hodgkins, CNM  oseltamivir (TAMIFLU) 75 MG capsule Take 1 capsule (75 mg total) by mouth 2 (two) times daily. 09/29/22   Elise Gladden, Murray Hodgkins, CNM      Social History: She  reports that she has never smoked. She has never used smokeless tobacco. She reports current drug use. Drug: Marijuana.  She reports that she does not drink alcohol.  Family History: family history includes Diabetes in her maternal grandmother; Heart disease in her maternal grandmother; Hypertension in her maternal grandmother and mother.   Review of Systems: A full review of systems was performed and negative except as noted in the HPI.     O:  BP 105/61   Pulse 94   Temp 98.6 F (37 C) (Oral)   Resp 18   Ht 5\' 4"  (1.626 m)   Wt 89.8 kg   LMP 09/13/2021 (Exact Date)   SpO2 99%   BMI 33.99 kg/m   Results for orders placed or performed during the hospital encounter of 09/29/22 (from the past 48 hour(s))  Wet prep, genital   Collection Time: 09/29/22  9:52 AM   Specimen: Vaginal  Result Value Ref Range   Yeast Wet Prep HPF POC NONE SEEN NONE SEEN   Trich, Wet Prep NONE SEEN NONE SEEN   Clue Cells Wet Prep HPF POC NONE SEEN NONE SEEN   WBC, Wet Prep HPF POC <10 <10   Sperm NONE SEEN   Resp panel by RT-PCR (RSV, Flu A&B, Covid) Vaginal   Collection Time: 09/29/22  9:52 AM   Specimen: Vaginal; Nasal Swab  Result Value Ref Range   SARS Coronavirus 2 by RT PCR NEGATIVE NEGATIVE   Influenza A by PCR NEGATIVE NEGATIVE   Influenza B by PCR POSITIVE (A) NEGATIVE   Resp Syncytial Virus by PCR NEGATIVE NEGATIVE  Constitutional: NAD, AAOx3  HE/ENT: extraocular movements grossly intact, moist mucous membranes CV: RRR PULM: nl respiratory effort, CTABL     Abd: gravid, non-tender, non-distended, soft      Ext: Non-tender, Nonedematous   Psych: mood appropriate, speech normal Pelvic:  no vaginal discharge noted.   Fetal  monitoring: Cat I Appropriate for GA Baseline: 140bpm Variability: moderate Accelerations: present x >2 Decelerations absent Time 73mins    A/P: 27 y.o. [redacted]w[redacted]d here for antenatal surveillance for cough, HA and vaginal mucus  Principle Diagnosis:  28wks, Influenza B  Preterm labor: not present.  Fetal Wellbeing: Reassuring Cat 1 tracing; Reactive NST  Rx Tamiflu 75mg  BID  x 5d Discussed preg safe meds, encouraged to contact her prenatal provider.  D/c home stable, precautions reviewed, follow-up as scheduled.    Francetta Found, CNM 09/29/2022  12:54 PM

## 2022-09-29 NOTE — ED Notes (Signed)
First nurse note: Pt here via POV with a chronic cough but also states she "lost some of her mucous plug". Pt denies ABD pain or bleeding.

## 2022-09-29 NOTE — Progress Notes (Signed)
Pt reports to L/D for evaluation from ED for an episode of noted yellow vaginal discharge yesterday. Pt reports that she came to hospital to be evaluated for productive cough (green sputum) that began two days ago  and has increased since yesterday. Patient's son is in ED being evaluated for respiratory symptoms.  Pt reports no bleeding or LOF and positive fetal movement. Monitors applied and assessing. Pt afebrile, SPO2 99% on room air.
